# Patient Record
Sex: Male | Born: 1965 | Smoking: Never smoker
Health system: Southern US, Community
[De-identification: ages and names within clinical notes are randomized; demographics above are authoritative.]

## PROBLEM LIST (undated history)

## (undated) HISTORY — PX: ROTATOR CUFF REPAIR: SHX139

## (undated) HISTORY — PX: MENISCUS REPAIR: SHX5179

---

## 2008-10-08 DEATH — deceased

## 2010-10-21 ENCOUNTER — Ambulatory Visit (HOSPITAL_BASED_OUTPATIENT_CLINIC_OR_DEPARTMENT_OTHER)
Admission: RE | Admit: 2010-10-21 | Discharge: 2010-10-21 | Disposition: A | Discharge status: Home / Self Care | Payer: BC Managed Care – PPO | Source: Ambulatory Visit | Attending: Specialist | Admitting: Specialist

## 2010-10-21 DIAGNOSIS — IMO0002 Reserved for concepts with insufficient information to code with codable children: Secondary | ICD-10-CM | POA: Insufficient documentation

## 2010-10-21 DIAGNOSIS — Z01812 Encounter for preprocedural laboratory examination: Secondary | ICD-10-CM | POA: Insufficient documentation

## 2010-10-21 DIAGNOSIS — S83419A Sprain of medial collateral ligament of unspecified knee, initial encounter: Secondary | ICD-10-CM | POA: Insufficient documentation

## 2010-10-21 DIAGNOSIS — Z01810 Encounter for preprocedural cardiovascular examination: Secondary | ICD-10-CM | POA: Insufficient documentation

## 2010-10-21 DIAGNOSIS — M2240 Chondromalacia patellae, unspecified knee: Secondary | ICD-10-CM | POA: Insufficient documentation

## 2010-10-21 DIAGNOSIS — M2350 Chronic instability of knee, unspecified knee: Secondary | ICD-10-CM | POA: Insufficient documentation

## 2010-10-21 DIAGNOSIS — X58XXXA Exposure to other specified factors, initial encounter: Secondary | ICD-10-CM | POA: Insufficient documentation

## 2010-10-21 LAB — POCT I-STAT 4, (NA,K, GLUC, HGB,HCT)
Glucose, Bld: 93 mg/dL (ref 70–99)
Hemoglobin: 14.3 g/dL (ref 13.0–17.0)
Potassium: 3.9 mEq/L (ref 3.5–5.1)
Sodium: 142 mEq/L (ref 135–145)

## 2010-10-22 NOTE — Op Note (Signed)
  [name], Thomas Allison            ACCOUNT NO.:  [id number]  MEDICAL RECORD NO.:  [id number]  LOCATION:                                 FACILITY:  PHYSICIAN:  Jene Every, M.D.    DATE OF BIRTH:  [birth year]  DATE OF PROCEDURE:  10/21/2010 DATE OF DISCHARGE:                              OPERATIVE REPORT   PREOPERATIVE DIAGNOSES:  Medial meniscus tear, right knee, chondromalacia patellofemoral joint, medial collateral ligament sprain.  POSTOPERATIVE DIAGNOSES:  Medial meniscus tear, right knee, chondromalacia patellofemoral joint, medial collateral ligament sprain.  PROCEDURE PERFORMED: 1. Exam under anesthesia. 2. Right knee arthroscopy with partial medial meniscectomy. 3. Chondroplasty, patellofemoral joint.  ANESTHESIA:  General.  ASSISTANT:  None.  BRIEF HISTORY:  45 year old with knee pain, locking, giving way, radial tear of the meniscus, indicated for partial meniscectomy and evaluation of the patellofemoral joint as well as an MCL sprain.  Risks and benefits discussed, including bleeding, infection, damage to vascular structures, no change in symptoms, worsening symptoms, need for repeat debridement, DVT, PE, anesthetic complications, etc.  TECHNIQUE:  The patient in supine position, after induction of adequate anesthesia, and 2 g of Kefzol, the left lower extremity was prepped and draped in the usual sterile fashion.  When examined under anesthesia, there was no laxity of the MCL noted.  A lateral parapatellar portal was fashioned with a #11 blade, an Ingress cannula atraumatically placed. Irrigant was utilized to insufflate the joint.  Under direct visualization, a medial parapatellar portal was fashioned with a #11 blade after localization with an 18-gauge needle, sparing the medial meniscus.  Noted immediately was the medial meniscus radial tear significantly went back to the meniscocapsular junction.  I introduced a basket rongeur and resected and contoured  the radial tear into a curvilinear tear.  We then used a 3.5 Cuda shaver and an ArthroWand to contour the resection and cauterized vessels at the junction.  The remnant was then stable to probe palpation.  The femoral condyle and tibial plateau was unremarkable.  ACL was unremarkable.  Lateral compartment revealed normal femoral condyle, tibial plateau and meniscus stable to probe palpation without evidence of tear.  Suprapatellar pouch revealed extensive grade 3 changes of the patellofemoral joint.  Chondroplasty performed of the patellofemoral joint as well as the sulcus.  There was normal patellofemoral tracking. Gutters were unremarkable.  Next, I revisited all compartments.  No further pathology was amenable to arthroscopic intervention.  Therefore, I removed all instrumentation. Portals were closed with 4-0 nylon simple sutures.  0.25% Marcaine with epinephrine was infiltrated in the joint.  Wound  was dressed sterilely. The patient was awoken without difficulty and transported to the recovery room in satisfactory condition.  The patient tolerated the procedure well.  There was no complication. No assistant.     Jene Every, M.D.     Cordelia Pen  D:  10/21/2010  T:  10/21/2010  Job:  [id number]  Electronically Signed by Jene Every M.D. on 10/22/2010 01:24:50 PM

## 2014-11-15 ENCOUNTER — Other Ambulatory Visit: Payer: Self-pay | Admitting: Orthopaedic Surgery

## 2014-11-15 DIAGNOSIS — M25512 Pain in left shoulder: Secondary | ICD-10-CM

## 2019-09-17 ENCOUNTER — Other Ambulatory Visit: Payer: Self-pay

## 2019-09-17 DIAGNOSIS — T7840XA Allergy, unspecified, initial encounter: Secondary | ICD-10-CM | POA: Insufficient documentation

## 2019-09-17 NOTE — ED Triage Notes (Signed)
Pt arrived via POV with c/o rash to bilateral legs and arms, torso, at buttocks. Pt also states he has some welts on his buttocks.  Pt taking benadryl which has helped some, pt reports he last took benadryl about 1-2 hours ago. PT states he took 50mg  at that time.  Unsure of what he is allergic. Pt denies any outdoor exposure, denies any new foods and no changes in laundry detergents.

## 2019-09-18 ENCOUNTER — Emergency Department
Admission: EM | Admit: 2019-09-18 | Discharge: 2019-09-18 | Disposition: A | Discharge status: Home / Self Care | Payer: 59 | Attending: Emergency Medicine | Admitting: Emergency Medicine

## 2019-09-18 ENCOUNTER — Other Ambulatory Visit: Payer: Self-pay

## 2019-09-18 ENCOUNTER — Emergency Department
Admission: EM | Admit: 2019-09-18 | Discharge: 2019-09-18 | Disposition: A | Discharge status: Home / Self Care | Payer: 59 | Source: Home / Self Care | Attending: Emergency Medicine | Admitting: Emergency Medicine

## 2019-09-18 DIAGNOSIS — T7840XA Allergy, unspecified, initial encounter: Secondary | ICD-10-CM

## 2019-09-18 DIAGNOSIS — L500 Allergic urticaria: Secondary | ICD-10-CM | POA: Insufficient documentation

## 2019-09-18 MED ORDER — DIPHENHYDRAMINE HCL 50 MG/ML IJ SOLN
INTRAMUSCULAR | Status: AC
  Administered 2019-09-18: 50 mg via INTRAVENOUS
  Filled 2019-09-18: qty 1

## 2019-09-18 MED ORDER — METHYLPREDNISOLONE SODIUM SUCC 125 MG IJ SOLR: 125.0000 mg | Freq: Once | INTRAMUSCULAR | Status: AC

## 2019-09-18 MED ORDER — FAMOTIDINE IN NACL 20-0.9 MG/50ML-% IV SOLN
20.0000 mg | Freq: Once | INTRAVENOUS | Status: AC
  Administered 2019-09-18: 20 mg via INTRAVENOUS
  Filled 2019-09-18: qty 50

## 2019-09-18 MED ORDER — CETIRIZINE HCL 10 MG PO TABS: 10.0000 mg | ORAL_TABLET | Freq: Every day | ORAL | 2 refills | Status: AC

## 2019-09-18 MED ORDER — DIPHENHYDRAMINE HCL 50 MG/ML IJ SOLN: 50.0000 mg | Freq: Once | INTRAMUSCULAR | Status: AC

## 2019-09-18 MED ORDER — FAMOTIDINE 20 MG PO TABS: 20.0000 mg | ORAL_TABLET | Freq: Every day | ORAL | 0 refills | Status: AC

## 2019-09-18 MED ORDER — DIPHENHYDRAMINE HCL 25 MG PO CAPS: 50.0000 mg | ORAL_CAPSULE | Freq: Four times a day (QID) | ORAL | 0 refills | Status: AC | PRN

## 2019-09-18 MED ORDER — FAMOTIDINE IN NACL 20-0.9 MG/50ML-% IV SOLN
20.0000 mg | Freq: Once | INTRAVENOUS | Status: AC
  Administered 2019-09-18: 20 mg via INTRAVENOUS

## 2019-09-18 MED ORDER — PREDNISONE 20 MG PO TABS
40.0000 mg | ORAL_TABLET | Freq: Once | ORAL | Status: AC
  Administered 2019-09-18: 40 mg via ORAL
  Filled 2019-09-18: qty 2

## 2019-09-18 MED ORDER — PREDNISONE 50 MG PO TABS: ORAL_TABLET | ORAL | 0 refills | Status: AC

## 2019-09-18 MED ORDER — METHYLPREDNISOLONE SODIUM SUCC 125 MG IJ SOLR
INTRAMUSCULAR | Status: AC
  Administered 2019-09-18: 125 mg via INTRAVENOUS
  Filled 2019-09-18: qty 2

## 2019-09-18 MED ORDER — DIPHENHYDRAMINE HCL 50 MG/ML IJ SOLN
50.0000 mg | Freq: Once | INTRAMUSCULAR | Status: AC
  Administered 2019-09-18: 50 mg via INTRAVENOUS
  Filled 2019-09-18: qty 1

## 2019-09-18 MED ORDER — METHYLPREDNISOLONE SODIUM SUCC 125 MG IJ SOLR
125.0000 mg | Freq: Once | INTRAMUSCULAR | Status: AC
  Administered 2019-09-18: 125 mg via INTRAVENOUS
  Filled 2019-09-18: qty 2

## 2019-09-18 MED ORDER — DIPHENHYDRAMINE HCL 50 MG/ML IJ SOLN
50.0000 mg | Freq: Once | INTRAMUSCULAR | Status: AC
Start: 2019-09-18 — End: 2019-09-18
  Administered 2019-09-18: 50 mg via INTRAVENOUS
  Filled 2019-09-18: qty 1

## 2019-09-18 NOTE — ED Triage Notes (Signed)
Pt seen last night/this morning with c/o rash/possible allergic reaction. Pt states was treated and discharged after rash resolved, pt states went home and rash began to return just PTA. Pt denies any SOB, ambulatory without difficulty, able to speak in full and complete sentences at this time.

## 2019-09-18 NOTE — ED Notes (Signed)
VS obtained by this RN. Pt c/o continued rash at this time. Pt visualized in NAD. VSS. Pt continues to rest in blue recliner at this time.

## 2019-09-18 NOTE — ED Triage Notes (Addendum)
Pt was DC this AM for allergic reaction. States rash has come back. Noted hives to arms. A&O, ambulatory. Unknown what he's reacting to.   Pt also states possible insect bite to L posterior calf.   Took 2 benadryl at 12:30pm

## 2019-09-18 NOTE — ED Provider Notes (Signed)
[facility] Emergency Department Provider Note  ____________________________________________  Time seen: Approximately 7:45 PM  I have reviewed the triage vital signs and the nursing notes.   HISTORY  Chief Complaint Allergic Reaction    HPI Thomas Allison is a 54 y.o. male that presents to the emergency department for evaluation of allergic reaction since yesterday.  Patient developed diffuse hives yesterday.  He was evaluated in this emergency department and given IV Benadryl, Pepcid, Solu-Medrol.  Hives improved following medications.  Patient went home and went to sleep.  When he woke up, hives had returned to both of his arms. He was discharged home on Zyrtec and Pepcid.  He was unsure if he should take more Benadryl.  Patient was given in the waiting room additional Solu-Medrol, Pepcid, Benadryl.  Rash has improved since receiving medications.  He denies any shortness of breath, chest pain, nausea, vomiting, abdominal pain.  He did have a bug bite to his lower leg, unsure of what kind of bug.  He cannot recall anything else that he may be allergic to.  His wife changed the sheets to the bed before he fell asleep this morning.  No new soaps, detergents, body washes, medications, foods, animals.   History reviewed. No pertinent past medical history.  There are no problems to display for this patient.   Past Surgical History:  Procedure Laterality Date  . MENISCUS REPAIR    . ROTATOR CUFF REPAIR      Prior to Admission medications   Medication Sig Start Date End Date Taking? Authorizing Provider  cetirizine (ZYRTEC ALLERGY) 10 MG tablet Take 1 tablet (10 mg total) by mouth daily. 09/18/19 09/17/20  Nita Sickle, MD  diphenhydrAMINE (BENADRYL) 25 mg capsule Take 2 capsules (50 mg total) by mouth every 6 (six) hours as needed. 09/18/19 09/17/20  Enid Derry, PA-C  famotidine (PEPCID) 20 MG tablet Take 1 tablet (20 mg total) by mouth daily for 5 days.  09/18/19 09/23/19  Nita Sickle, MD  predniSONE (DELTASONE) 50 MG tablet Take 1 tablet per day 09/18/19   Enid Derry, PA-C    Allergies Patient has no known allergies.  History reviewed. No pertinent family history.  Social History Social History   Tobacco Use  . Smoking status: Never Smoker  Substance Use Topics  . Alcohol use: Not Currently  . Drug use: Not on file     Review of Systems  Constitutional: No fever/chills ENT: No upper respiratory complaints. Cardiovascular: No chest pain. Respiratory: No SOB. Gastrointestinal: No abdominal pain.  No nausea, no vomiting.  Musculoskeletal: Negative for musculoskeletal pain. Skin: Negative for abrasions, lacerations, ecchymosis. Positive for rash. Neurological: Negative for headaches, numbness or tingling   ____________________________________________   PHYSICAL EXAM:  VITAL SIGNS: ED Triage Vitals  Enc Vitals Group     BP 09/18/19 1400 (!) 149/87     Pulse Rate 09/18/19 1400 (!) 103     Resp 09/18/19 1400 16     Temp 09/18/19 1400 98.4 F (36.9 C)     Temp Source 09/18/19 1400 Oral     SpO2 09/18/19 1400 97 %     Weight 09/18/19 1401 240 lb (108.9 kg)     Height 09/18/19 1401 5\' 8"  (1.727 m)     Head Circumference --      Peak Flow --      Pain Score 09/18/19 1401 3     Pain Loc --      Pain Edu? --  Excl. in GC? --      Constitutional: Alert and oriented. Well appearing and in no acute distress. Eyes: Conjunctivae are normal. PERRL. EOMI. Head: Atraumatic. ENT:      Ears:      Nose: No congestion/rhinnorhea.      Mouth/Throat: Mucous membranes are moist.  Neck: No stridor.  Cardiovascular: Normal rate, regular rhythm.  Good peripheral circulation. Respiratory: Normal respiratory effort without tachypnea or retractions. Lungs CTAB. Good air entry to the bases with no decreased or absent breath sounds. Gastrointestinal: Bowel sounds 4 quadrants. Soft and nontender to palpation. No guarding  or rigidity. No palpable masses. No distention.  Musculoskeletal: Full range of motion to all extremities. No gross deformities appreciated. Neurologic:  Normal speech and language. No gross focal neurologic deficits are appreciated.  Skin:  Skin is warm, dry and intact. Few hives to distal biceps. Psychiatric: Mood and affect are normal. Speech and behavior are normal. Patient exhibits appropriate insight and judgement.   ____________________________________________   LABS (all labs ordered are listed, but only abnormal results are displayed)  Labs Reviewed - No data to display ____________________________________________  EKG   ____________________________________________  RADIOLOGY  No results found.  ____________________________________________    PROCEDURES  Procedure(s) performed:    Procedures    Medications  diphenhydrAMINE (BENADRYL) injection 50 mg (50 mg Intravenous Given 09/18/19 1413)  methylPREDNISolone sodium succinate (SOLU-MEDROL) 125 mg/2 mL injection 125 mg (125 mg Intravenous Given 09/18/19 1412)  famotidine (PEPCID) IVPB 20 mg premix (0 mg Intravenous Stopped 09/18/19 1444)  predniSONE (DELTASONE) tablet 40 mg (40 mg Oral Given 09/18/19 1946)  diphenhydrAMINE (BENADRYL) injection 50 mg (50 mg Intravenous Given 09/18/19 1947)     ____________________________________________   INITIAL IMPRESSION / ASSESSMENT AND PLAN / ED COURSE  Pertinent labs & imaging results that were available during my care of the patient were reviewed by me and considered in my medical decision making (see chart for details).  Review of the [place] CSRS was performed in accordance of the [facility] prior to dispensing any controlled drugs.     Patient's diagnosis is consistent with allergic reaction.  Patient had been evaluated last night for allergic reaction and received Solu-Medrol, Pepcid, Benadryl at that time.  He returned today with return of hives.  Patient was given  Benadryl, Solu-Medrol, Pepcid in the waiting room by MD prior to my evaluation.  Hives have improved.  He still has a few hives to the distal bicep.  He was given a dose of oral prednisone and an additional dose of IV Benadryl, as it has been 6 hours since his previous dose of Benadryl.  Patient denies any shortness of breath, chest pain, nausea, vomiting, abdominal pain or signs of anaphylaxis.  Patient will be discharged home with prescriptions for prednisone and Benadryl.  Dr. Scotty Court is agreeable with plan.  Patient is to follow up with primary care as directed. Patient is given ED precautions to return to the ED for any worsening or new symptoms.  Kaikoa Magro was evaluated in Emergency Department on 09/18/2019 for the symptoms described in the history of present illness. He was evaluated in the context of the global COVID-19 pandemic, which necessitated consideration that the patient might be at risk for infection with the SARS-CoV-2 virus that causes COVID-19. Institutional protocols and algorithms that pertain to the evaluation of patients at risk for COVID-19 are in a state of rapid change based on information released by regulatory bodies including the CDC and federal and state organizations.  These policies and algorithms were followed during the patient's care in the ED.   ____________________________________________  FINAL CLINICAL IMPRESSION(S) / ED DIAGNOSES  Final diagnoses:  Allergic reaction, initial encounter      NEW MEDICATIONS STARTED DURING THIS VISIT:  ED Discharge Orders         Ordered    predniSONE (DELTASONE) 50 MG tablet     Discontinue  Reprint     09/18/19 2029    diphenhydrAMINE (BENADRYL) 25 mg capsule  Every 6 hours PRN     Discontinue  Reprint     09/18/19 2029              This chart was dictated using voice recognition software/Dragon. Despite best efforts to proofread, errors can occur which can change the meaning. Any change was purely  unintentional.    Enid Derry, PA-C 09/18/19 2103    Sharman Cheek, MD 09/18/19 2115

## 2019-09-18 NOTE — ED Provider Notes (Signed)
[facility] Emergency Department Provider Note  ____________________________________________  Time seen: Approximately 5:43 AM  I have reviewed the triage vital signs and the nursing notes.   HISTORY  Chief Complaint Rash   HPI Thomas Allison is a 54 y.o. male with no significant past medical history who presents for evaluation of a rash.  Patient reports being in his usual state of health when he went to bed last night.  Woke up this morning with a diffuse pruritic erythematous rash.  Denies angioedema, throat closing sensation, vomiting or diarrhea, chest pain or shortness of breath, difficulty swallowing or breathing.  Patient denies any new foods, new medications, new detergents/soap.  Patient denies any prior history of allergic reaction.   He took some Benadryl at home with improvement of his symptoms.    Allergies Patient has no known allergies.  Fh Diabetes Type 1/Insulin Dependent Brother Alinda Money   No Known Problems Brother Baldo Ash   Colon polyps Father    Coronary Artery Disease (Blocked arteries around heart) Father    Coronary Artery Disease (Blocked arteries around heart) Maternal Grandfather    Brain cancer Maternal Grandmother    Arthritis Mother    Coronary Artery Disease (Blocked arteries around heart) Paternal Grandfather       Social History Alcohol - no Smoking - no  Review of Systems  Constitutional: Negative for fever. Eyes: Negative for visual changes. ENT: Negative for sore throat. Neck: No neck pain  Cardiovascular: Negative for chest pain. Respiratory: Negative for shortness of breath. Gastrointestinal: Negative for abdominal pain, vomiting or diarrhea. Genitourinary: Negative for dysuria. Musculoskeletal: Negative for back pain. Skin: + rash. Neurological: Negative for headaches, weakness or numbness. Psych: No SI or HI  ____________________________________________   PHYSICAL EXAM:  VITAL  SIGNS: ED Triage Vitals  Enc Vitals Group     BP 09/17/19 2333 94/64     Pulse Rate 09/17/19 2333 93     Resp 09/17/19 2333 18     Temp 09/17/19 2333 99 F (37.2 C)     Temp Source 09/17/19 2333 Oral     SpO2 09/17/19 2333 97 %     Weight 09/17/19 2335 240 lb (108.9 kg)     Height 09/17/19 2335 5\' 8"  (1.727 m)     Head Circumference --      Peak Flow --      Pain Score 09/17/19 2347 0     Pain Loc --      Pain Edu? --      Excl. in GC? --     Constitutional: Alert and oriented. Well appearing and in no apparent distress. HEENT:      Head: Normocephalic and atraumatic.         Eyes: Conjunctivae are normal. Sclera is non-icteric.       Mouth/Throat: Mucous membranes are moist.  No angioedema, airways patent, no stridor, no swelling of the uvula or tongue, no drooling      Neck: Supple with no signs of meningismus. Cardiovascular: Regular rate and rhythm. Respiratory: Normal respiratory effort. Lungs are clear to auscultation bilaterally.  Gastrointestinal: Soft, non tender. Musculoskeletal: No edema, cyanosis, or erythema of extremities. Neurologic: Normal speech and language. Face is symmetric. Moving all extremities. No gross focal neurologic deficits are appreciated. Skin: Skin is warm, dry and intact.  Diffuse hives Psychiatric: Mood and affect are normal. Speech and behavior are normal.  ____________________________________________   LABS (all labs ordered are listed, but only abnormal results are displayed)  Labs Reviewed - No data to display ____________________________________________  EKG  none  ____________________________________________  RADIOLOGY  none  ____________________________________________   PROCEDURES  Procedure(s) performed: None Procedures Critical Care performed:  None ____________________________________________   INITIAL IMPRESSION / ASSESSMENT AND PLAN / ED COURSE   54 y.o. male with no significant past medical history who  presents for evaluation of a rash.  Patient woke up this morning with diffuse hives.  Took some Benadryl at home with minimal relief.  Upon arrival to the emergency room no signs of anaphylaxis.  Patient is covered in hives.  Will give IV Benadryl, Pepcid, Solu-Medrol.  Unclear allergen since patient does not seem to be on any new medications, new foods, or new soaps or detergents.  Recommended follow-up with PCP for testing.  Will monitor for any signs of anaphylaxis.  Old medical records reviewed.  Patient placed on telemetry for close monitoring.  _________________________ 6:35 AM on 09/18/2019 -----------------------------------------  Rash resolved.  Patient remains well-appearing no signs of anaphylaxis.  Will discharge home on Zyrtec and Pepcid.  Discussed my standard return precautions and follow-up with PCP.    _____________________________________________ Please note:  Patient was evaluated in Emergency Department today for the symptoms described in the history of present illness. Patient was evaluated in the context of the global COVID-19 pandemic, which necessitated consideration that the patient might be at risk for infection with the SARS-CoV-2 virus that causes COVID-19. Institutional protocols and algorithms that pertain to the evaluation of patients at risk for COVID-19 are in a state of rapid change based on information released by regulatory bodies including the CDC and federal and state organizations. These policies and algorithms were followed during the patient's care in the ED.  Some ED evaluations and interventions may be delayed as a result of limited staffing during the pandemic.   [place] Controlled Substance Database was reviewed by me. ____________________________________________   FINAL CLINICAL IMPRESSION(S) / ED DIAGNOSES   Final diagnoses:  Allergic reaction, initial encounter      NEW MEDICATIONS STARTED DURING THIS VISIT:  ED Discharge Orders         Ordered     famotidine (PEPCID) 20 MG tablet  Daily     Discontinue  Reprint     09/18/19 0621    cetirizine (ZYRTEC ALLERGY) 10 MG tablet  Daily     Discontinue  Reprint     09/18/19 0998           Note:  This document was prepared using Dragon voice recognition software and may include unintentional dictation errors.    Don Perking, [place], MD 09/18/19 [phone]

## 2019-09-18 NOTE — ED Notes (Signed)
This RN spoke with Dr. Lenard Lance regarding patient care and pt c/o slight worsening of rash and itching returning. No new orders received at this time. This RN explained to patient to call out if he began to feel any SOB, swelling, difficulty breathing/swallowing. Pt states understanding at this time. Pt visualized in NAD. This RN apologized for wait. Pt states understanding at this time. Call bell within reach of patient at this time.

## 2020-05-23 ENCOUNTER — Emergency Department: Payer: 59

## 2020-05-23 ENCOUNTER — Other Ambulatory Visit: Payer: Self-pay

## 2020-05-23 ENCOUNTER — Emergency Department
Admission: EM | Admit: 2020-05-23 | Discharge: 2020-05-23 | Disposition: A | Discharge status: Home / Self Care | Payer: 59 | Attending: Emergency Medicine | Admitting: Emergency Medicine

## 2020-05-23 DIAGNOSIS — R110 Nausea: Secondary | ICD-10-CM | POA: Diagnosis not present

## 2020-05-23 DIAGNOSIS — R42 Dizziness and giddiness: Secondary | ICD-10-CM | POA: Diagnosis present

## 2020-05-23 LAB — BASIC METABOLIC PANEL
Anion gap: 8 (ref 5–15)
BUN: 13 mg/dL (ref 6–20)
CO2: 24 mmol/L (ref 22–32)
Calcium: 9 mg/dL (ref 8.9–10.3)
Chloride: 104 mmol/L (ref 98–111)
Creatinine, Ser: 1 mg/dL (ref 0.61–1.24)
GFR, Estimated: >60 mL/min (ref >60)
Glucose, Bld: 103 mg/dL — ABNORMAL HIGH (ref 70–99)
Potassium: 3.7 mmol/L (ref 3.5–5.1)
Sodium: 136 mmol/L (ref 135–145)

## 2020-05-23 LAB — CBC
HCT: 44.1 % (ref 39.0–52.0)
Hemoglobin: 15.5 g/dL (ref 13.0–17.0)
MCH: 30.6 pg (ref 26.0–34.0)
MCHC: 35.1 g/dL (ref 30.0–36.0)
MCV: 87.2 fL (ref 80.0–100.0)
Platelets: 232 10*3/uL (ref 150–400)
RBC: 5.06 MIL/uL (ref 4.22–5.81)
RDW: 13.4 % (ref 11.5–15.5)
WBC: 8 10*3/uL (ref 4.0–10.5)
nRBC: 0 % (ref 0.0–0.2)

## 2020-05-23 LAB — TROPONIN I (HIGH SENSITIVITY): Troponin I (High Sensitivity): 4 ng/L (ref <18)

## 2020-05-23 NOTE — ED Notes (Signed)
Dr. Funke at bedside. 

## 2020-05-23 NOTE — Discharge Instructions (Addendum)
Follow-up with your primary care doctor for recheck of your blood pressure.

## 2020-05-23 NOTE — ED Provider Notes (Signed)
[facility] Emergency Department Provider Note  ____________________________________________   Event Date/Time   First MD Initiated Contact with Patient 05/23/20 2049     (approximate)  I have reviewed the triage vital signs and the nursing notes.   HISTORY  Chief Complaint dizziness    HPI Rayfield Beem is a 55 y.o. male otherwise healthy comes in with dizziness.  Patient states that he was at work.  Around 9 AM he started feeling lightheaded.  He stepped out of his office and he started getting sweaty and having some nausea and feeling like the room was spinning.  He states that he did not eat anything for breakfast but this is typical for him.  He ate a banana and drink some water and he started feeling better after about 30 minutes.  Currently he is at his baseline self just some fatigue.  Symptoms were constant, moderate, better with the above, unclear what brought it on.  Does report having an episode similar to this 3 months ago but not as bad. Denies any abdominal pain, chest pain, shortness of breath  On review of records from urgent care patient had orthostatics that were normal.  Patient was found to have elevated blood pressure.  And there was concern for T wave inversion however I did review his prior EKG and this was similar.          History reviewed. No pertinent past medical history.  There are no problems to display for this patient.   Past Surgical History:  Procedure Laterality Date  . MENISCUS REPAIR    . ROTATOR CUFF REPAIR      Prior to Admission medications   Medication Sig Start Date End Date Taking? Authorizing Provider  cetirizine (ZYRTEC ALLERGY) 10 MG tablet Take 1 tablet (10 mg total) by mouth daily. 09/18/19 09/17/20  Nita Sickle, MD  diphenhydrAMINE (BENADRYL) 25 mg capsule Take 2 capsules (50 mg total) by mouth every 6 (six) hours as needed. 09/18/19 09/17/20  Enid Derry, PA-C  famotidine (PEPCID) 20 MG  tablet Take 1 tablet (20 mg total) by mouth daily for 5 days. 09/18/19 09/23/19  Nita Sickle, MD  predniSONE (DELTASONE) 50 MG tablet Take 1 tablet per day 09/18/19   Enid Derry, PA-C    Allergies Patient has no known allergies.  No family history on file.  Social History Social History   Tobacco Use  . Smoking status: Never Smoker  Substance Use Topics  . Alcohol use: Not Currently      Review of Systems Constitutional: No fever/chills, dizziness, hot flash Eyes: No visual changes. ENT: No sore throat. Cardiovascular: Denies chest pain. Respiratory: Denies shortness of breath. Gastrointestinal: No abdominal pain.  Positive nausea vomiting.  No diarrhea.  No constipation. Genitourinary: Negative for dysuria. Musculoskeletal: Negative for back pain. Skin: Negative for rash. Neurological: Negative for headaches, focal weakness or numbness. All other ROS negative ____________________________________________   PHYSICAL EXAM:  VITAL SIGNS: ED Triage Vitals [05/23/20 1904]  Enc Vitals Group     BP      Pulse      Resp      Temp      Temp src      SpO2      Weight      Height      Head Circumference      Peak Flow      Pain Score 0     Pain Loc      Pain Edu?  Excl. in GC?     Constitutional: Alert and oriented. Well appearing and in no acute distress. Eyes: Conjunctivae are normal. EOMI. Head: Atraumatic. Nose: No congestion/rhinnorhea. Mouth/Throat: Mucous membranes are moist.   Neck: No stridor. Trachea Midline. FROM Cardiovascular: Normal rate, regular rhythm. Grossly normal heart sounds.  Good peripheral circulation. Respiratory: Normal respiratory effort.  No retractions. Lungs CTAB. Gastrointestinal: Soft and nontender. No distention. No abdominal bruits.  Musculoskeletal: No lower extremity tenderness nor edema.  No joint effusions. Neurologic:  Normal speech and language. No gross focal neurologic deficits are appreciated.  Cranial  nerves intact.  Equal strength in arms and legs Skin:  Skin is warm, dry and intact. No rash noted. Psychiatric: Mood and affect are normal. Speech and behavior are normal. GU: Deferred   ____________________________________________   LABS (all labs ordered are listed, but only abnormal results are displayed)  Labs Reviewed  BASIC METABOLIC PANEL - Abnormal; Notable for the following components:      Result Value   Glucose, Bld 103 (*)    All other components within normal limits  CBC  TROPONIN I (HIGH SENSITIVITY)  TROPONIN I (HIGH SENSITIVITY)   ____________________________________________   ED ECG REPORT I, Concha Se, the attending physician, personally viewed and interpreted this ECG.  Normal sinus rate of 69, no ST elevation, T wave inversion lead III, normal intervals  Reviewed prior EKG looks similar to previous ____________________________________________  RADIOLOGY I, Concha Se, personally viewed and evaluated these images (plain radiographs) as part of my medical decision making, as well as reviewing the written report by the radiologist.  ED MD interpretation: No pneumonia  Official radiology report(s): DG Chest 2 View  Result Date: 05/23/2020 CLINICAL DATA:  Dizziness abnormal EKG EXAM: CHEST - 2 VIEW COMPARISON:  None. FINDINGS: The heart size and mediastinal contours are within normal limits. Both lungs are clear. The visualized skeletal structures are unremarkable. IMPRESSION: No active cardiopulmonary disease. Electronically Signed   By: Jasmine Pang M.D.   On: 05/23/2020 19:48    ____________________________________________   PROCEDURES  Procedure(s) performed (including Critical Care):  Procedures   ____________________________________________   INITIAL IMPRESSION / ASSESSMENT AND PLAN / ED COURSE  Keishawn Rajewski was evaluated in Emergency Department on 05/23/2020 for the symptoms described in the history of present illness. He was  evaluated in the context of the global COVID-19 pandemic, which necessitated consideration that the patient might be at risk for infection with the SARS-CoV-2 virus that causes COVID-19. Institutional protocols and algorithms that pertain to the evaluation of patients at risk for COVID-19 are in a state of rapid change based on information released by regulatory bodies including the CDC and federal and state organizations. These policies and algorithms were followed during the patient's care in the ED.    Patient is a 55 year old gentleman who comes in with episode of dizziness, nausea, feeling overheated.  This happened at 9 AM.  Sent to our ER for EKG and cardiac markers due to concern for possible T wave versions.  Patient's EKG here looks very similar to prior and cardiac marker was negative and symptoms have been going on for greater than 3 hours ago so does not need repeat given low heart score.  He denies any chest pain.  He denies any shortness of breath suggest PE.  No abdominal pain to suggest AAA.  He denies any continued symptoms to suggest stroke.  At this time I suspect is more likely vertigo.  He never lost consciousness  as well to suggest syncope.  Patient blood pressure is elevated and he will follow that up for repeat check with his primary care doctor but at this time he feels comfortable going home  I discussed the provisional nature of ED diagnosis, the treatment so far, the ongoing plan of care, follow up appointments and return precautions with the patient and any family or support people present. They expressed understanding and agreed with the plan, discharged home.           ____________________________________________   FINAL CLINICAL IMPRESSION(S) / ED DIAGNOSES   Final diagnoses:  Dizziness      MEDICATIONS GIVEN DURING THIS VISIT:  Medications - No data to display   ED Discharge Orders    None       Note:  This document was prepared using Dragon  voice recognition software and may include unintentional dictation errors.   Concha Se, MD 05/23/20 2126

## 2020-05-23 NOTE — ED Triage Notes (Signed)
Pt comes from UC with c/o abnormal EKG and dizziness episode this am. Pt states he was at meeting and had to excuse himself because he got dizzy, felt nauseated and diaphoretic.  Pt denies any current symptoms. Pt denies any CP or SOb.

## 2020-05-23 NOTE — ED Notes (Signed)
Per provider, pt okay to be discharged without repeat trop

## 2020-06-11 NOTE — Patient Instructions (Signed)
VESTIBULAR AND BALANCE EVALUATION   HISTORY:  Subjective history of current problem: Thomas Allison is a 55 y.o. male otherwise healthy comes in with dizziness.  Patient states that he was at work.  Around 9 AM he started feeling lightheaded.  He stepped out of his office and he started getting sweaty and having some nausea and feeling like the room was spinning.  He states that he did not eat anything for breakfast but this is typical for him.  He ate a banana and drink some water and he started feeling better after about 30 minutes.  Currently he is at his baseline self just some fatigue.  Symptoms were constant, moderate, better with the above, unclear what brought it on.  Does report having an episode similar to this 3 months ago but not as bad. Denies any abdominal pain, chest pain, shortness of breath  On review of records from urgent care patient had orthostatics that were normal.  Patient was found to have elevated blood pressure.  And there was concern for T wave inversion however I did review his prior EKG and this was similar. Description of dizziness: (vertigo, unsteadiness, lightheadedness, falling, general unsteadiness, whoozy, swimmy-headed sensation, aural fullness) Frequency:  Duration: Symptom nature: (motion provoked, positional, spontaneous, constant, variable, intermittent)   Provocative Factors: Easing Factors:  Progression of symptoms: (better, worse, no change since onset) History of similar episodes:  Falls (yes/no): Number of falls in past 6 months:   Prior Functional Level:   Auditory complaints (tinnitus, pain, drainage, hearing loss, aural fullness): Vision (diplopia, visual field loss, recent changes, last eye exam):  Red Flags: (dysarthria, dysphagia, drop attacks, bowel and bladder changes, recent weight loss/gain) Review of systems negative for red flags.     EXAMINATION  POSTURE:   NEUROLOGICAL SCREEN: (2+ unless otherwise noted.) N=normal   Ab=abnormal  Level Dermatome R L Myotome R L Reflex R L  C3 Anterior Neck N N Sidebend C2-3 N N Jaw CN V    C4 Top of Shoulder N N Shoulder Shrug C4 N N Hoffman's UMN    C5 Lateral Upper Arm N N Shoulder ABD C4-5 N N Biceps C5-6    C6 Lateral Arm/ Thumb N N Arm Flex/ Wrist Ext C5-6 N N Brachiorad. C5-6    C7 Middle Finger N N Arm Ext//Wrist Flex C6-7 N N Triceps C7    C8 4th & 5th Finger N N Flex/ Ext Carpi Ulnaris C8 N N Patellar (L3-4)    T1 Medial Arm N N Interossei T1 N N Gastrocnemius    L2 Medial thigh/groin N N Illiopsoas (L2-3) N N     L3 Lower thigh/med.knee N N Quadriceps (L3-4) N N     L4 Medial leg/lat thigh N N Tibialis Ant (L4-5) N N     L5 Lat. leg & dorsal foot N N EHL (L5) N N     S1 post/lat foot/thigh/leg N N Gastrocnemius (S1-2) N N     S2 Post./med. thigh & leg N N Hamstrings (L4-S3) N N       Cranial Nerves Visual acuity and visual fields are intact  Extraocular muscles are intact  Facial sensation is intact bilaterally  Facial strength is intact bilaterally  Hearing is normal as tested by gross conversation Palate elevates midline, normal phonation  Shoulder shrug strength is intact  Tongue protrudes midline    SOMATOSENSORY:         Sensation  Intact      Diminished         Absent  Light touch       COORDINATION: Finger to Nose: Normal Heel to Shin: Normal Pronator Drift: Negative Rapid Alternating Movements: Normal Finger to Thumb Opposition: Normal  MUSCULOSKELETAL SCREEN: Cervical Spine ROM: WFL and painless in all planes. No gross deficits identified   ROM:   MMT:   Functional Mobility:  Gait: Scanning of visual environment with gait is:    POSTURAL CONTROL TESTS:   Clinical Test of Sensory Interaction for Balance    (CTSIB):  CONDITION TIME STRATEGY SWAY  Eyes open, firm surface 30 seconds ankle   Eyes closed, firm surface 30 seconds ankle   Eyes open, foam surface 30 seconds ankle   Eyes closed, foam surface 30  seconds ankle     OCULOMOTOR / VESTIBULAR TESTING:  Oculomotor Exam- Room Light  Findings Comments  Ocular Alignment {normal/abnormal/not examined:14677}   Ocular ROM {normal/abnormal/not examined:14677}   Spontaneous Nystagmus {normal/abnormal/not examined:14677}   Gaze-Holding Nystagmus {normal/abnormal/not examined:14677}   End-Gaze Nystagmus {normal/abnormal/not examined:14677}   Vergence (normal 2-3") {normal/abnormal/not examined:14677}   Smooth Pursuit {normal/abnormal/not examined:14677}   Cross-Cover Test {normal/abnormal/not examined:14677}   Saccades {normal/abnormal/not examined:14677}   VOR Cancellation {normal/abnormal/not examined:14677}   Left Head Impulse {normal/abnormal/not examined:14677}   Right Head Impulse {normal/abnormal/not examined:14677}   Static Acuity {normal/abnormal/not examined:14677}   Dynamic Acuity {normal/abnormal/not examined:14677}     Oculomotor Exam- Fixation Suppressed  Findings Comments  Ocular Alignment {normal/abnormal/not examined:14677}   Spontaneous Nystagmus {normal/abnormal/not examined:14677}   Gaze-Holding Nystagmus {normal/abnormal/not examined:14677}   End-Gaze Nystagmus {normal/abnormal/not examined:14677}   Head Shaking Nystagmus {normal/abnormal/not examined:14677}   Pressure-Induced Nystagmus {normal/abnormal/not examined:14677}   Hyperventilation Induced Nystagmus {normal/abnormal/not examined:14677}   Skull Vibration Induced Nystagmus {normal/abnormal/not examined:14677}     BPPV TESTS:  Symptoms Duration Intensity Nystagmus  L Dix-Hallpike None   None  R Dix-Hallpike None   None  L Head Roll None   None  R Head Roll None   None  L Sidelying Test      R Sidelying Test        FUNCTIONAL OUTCOME MEASURES   Results Comments  BERG /56 Fall risk, in need of intervention  DGI /24   FGA /30   TUG seconds   5TSTS seconds   6 Minute Walk Test    10 Meter Gait Speed Self-selected: s = m/s; Fastest: s = m/s Below  normative values for full community ambulation  ABC Scale %   DHI /100     ASSESSMENT Clinical Impression: Pt is a pleasant year-old male/male referred for difficulty with baalance. PT examination reveals deficits . Pt presents with deficits in strength, gait and balance. Pt will benefit from skilled PT services to address deficits in balance and decrease risk for future falls.   Low (stable): no personal factors/comorbidities, 1-2 body systems/activity limitations/participation restrictions   Moderate (evolving): 1-2 personal factors/comorbidities, 3 or more body systems/activity limitations/participation restrictions   High (unstable): 3 or more personal factors/comorbidities, 4 or more body systems/activity limitations/participation restrictions    PLAN Next Visit: HEP:    Pt will be independent with HEP in order to improve strength and balance in order to decrease fall risk and improve function at home and work.   Pt will improve BERG by at least 3 points in order to demonstrate clinically significant improvement in balance.    Pt will improve DGI by at least 3 points in order to demonstrate clinically significant improvement in  balance and decreased risk for falls.  Pt will improve ABC by at least 13% in order to demonstrate clinically significant improvement in balance confidence.   Pt will decrease 5TSTS by at least 3 seconds in order to demonstrate clinically significant improvement in LE strength.  Pt will decrease TUG to below 14 seconds/decrease in order to demonstrate decreased fall risk.  Pt will decrease DHI score by at least 18 points in order to demonstrate clinically significant reduction in disability   Pt will increase by at least 46m (160ft) in order to demonstrate clinically significant improvement in cardiopulmonary endurance and community ambulation

## 2020-06-12 ENCOUNTER — Ambulatory Visit: Payer: 59 | Attending: Family Medicine

## 2020-06-12 ENCOUNTER — Other Ambulatory Visit: Payer: Self-pay

## 2020-06-12 DIAGNOSIS — R42 Dizziness and giddiness: Secondary | ICD-10-CM | POA: Insufficient documentation

## 2020-06-12 NOTE — Therapy (Signed)
[facility] [address] [place], [place], [address] Phone: [phone]   Fax:  [phone]  Physical Therapy Screen  Patient Details  Name: Thomas Allison MRN: [id number] Date of Birth: 12-28-1965 No data recorded  Encounter Date: 06/12/2020   PT End of Session - 06/12/20 1647    Visit Number 1    Number of Visits 1    Date for PT Re-Evaluation 07/31/20    PT Start Time 1100    PT Stop Time 1145    PT Time Calculation (min) 45 min    Activity Tolerance Patient tolerated treatment well    Behavior During Therapy [facility] for tasks assessed/performed           History reviewed. No pertinent past medical history.  Past Surgical History:  Procedure Laterality Date  . MENISCUS REPAIR    . ROTATOR CUFF REPAIR      There were no vitals filed for this visit.     VESTIBULAR AND BALANCE SCREEN   HISTORY:  Subjective history of current problem: Pt is a self-referral for a physical therapy screen related to his dizziness. 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 He started having vertigo which lasted 1-2 minutes. 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 He also felt like he was bout to pass out, had nausea, and became diaphoretic. He had to excuse himself and went to his desk. Pt states that the total duration of the symptoms were about 1 hour but gradually improved over that time. He had not eaten that morning and was told by some of his colleagues that it may be related to low blood sugar however he normally does not eat breakfast. He called his PCP who advised him to go to the ER. He went to an urgent care instead and due to some concerns on his EKG and his presentation was sent to the ED for cardiac enzymes. He went to the ER however the EKG changes noted at the urgent care were similar to other prior EKGs the ED  provider had on file and his troponin was negative. He was discharged to follow up with his PCP. His PCP felt that his dizziness was related to peripheral vertigo and he was advised to do the Epley maneuver at home. Pt has not tried the Epley maneuver at home. Since the the initial onset the severe dizziness has resolved but he continues to report baseline dizziness which worsens with quick head movements. He notices that his symptoms are worse in the morning and get better as the day progresses. Symptoms worsen when laying down in bed. Denies any similar episodes in the past. No previous cardiac issues. Denies any chest pain or palpitations. Reports intermittent headaches in the morning but no history of migraines. No visual changes during these episodes. Denies auditory or visual symptoms. He states that if music is too loud he sometimes gets a "static sound in his ear" but otherwise denies tinnitus. Denies numbness/tingling or focal weakness. He does confirm that he developed a cold right after his symptoms started. It has since resolved without any additional intervention.   Description of dizziness: dizziness, lightheadedness, unsteadiness (vertigo, unsteadiness, lightheadedness, falling, general unsteadiness, whoozy, swimmy-headed sensation, aural fullness) Frequency: daily Duration: hours but worsens for seconds with quick head turns Symptom nature: (motion provoked, positional, spontaneous, constant, variable, intermittent) Mostly motion  provoked  Provocative Factors: laying down quickly, quick head motions Easing Factors: Unknown  Progression of symptoms: (better, worse, no change since onset) improved History of similar episodes: one brief episodes which lasted 1-2 minutes three months ago but none prior   Falls (yes/no): no Number of falls in past 6 months: none  Prior Functional Level: Independent with ambulation without assistive device. Works full time for [employer];  Auditory complaints (tinnitus, pain, drainage, hearing loss, aural fullness): Denies Vision (diplopia, visual field loss, recent changes, last eye exam): Denies  Red Flags: Denies (dysarthria, dysphagia, drop attacks, bowel and bladder changes, recent weight loss/gain) Review of systems negative for red flags.     EXAMINATION  POSTURE: WNL  NEUROLOGICAL SCREEN: (2+ unless otherwise noted.) N=normal  Ab=abnormal  Level Dermatome R L Myotome R L Reflex R L  C3 Anterior Neck N N Sidebend C2-3 N N Jaw CN V    C4 Top of Shoulder N N Shoulder Shrug C4 N N Hoffman's UMN    C5 Lateral Upper Arm N N Shoulder ABD C4-5 N N Biceps C5-6    C6 Lateral Arm/ Thumb N N Arm Flex/ Wrist Ext C5-6 N N Brachiorad. C5-6    C7 Middle Finger N N Arm Ext//Wrist Flex C6-7 N N Triceps C7    C8 4th & 5th Finger N N Flex/ Ext Carpi Ulnaris C8 N N Patellar (L3-4)    T1 Medial Arm N N Interossei T1 N N Gastrocnemius    L2 Medial thigh/groin N N Illiopsoas (L2-3) N N     L3 Lower thigh/med.knee N N Quadriceps (L3-4) N N     L4 Medial leg/lat thigh N N Tibialis Ant (L4-5) N N     L5 Lat. leg & dorsal foot N N EHL (L5) N N     S1 post/lat foot/thigh/leg N N Gastrocnemius (S1-2) N N     S2 Post./med. thigh & leg N N Hamstrings (L4-S3) N N       Cranial Nerves Visual acuity and visual fields are intact  Extraocular muscles are intact  Facial sensation is intact bilaterally  Facial strength is intact bilaterally  Hearing is normal as tested by gross conversation Palate elevates midline, normal phonation  Shoulder shrug strength is intact  Tongue protrudes midline    SOMATOSENSORY:         Sensation           Intact      Diminished         Absent  Light touch WNL      COORDINATION: Finger to Nose: Normal Heel to Shin: Normal Pronator Drift: Negative Rapid Alternating Movements: Normal Finger to Thumb Opposition: Normal  MUSCULOSKELETAL SCREEN: Cervical Spine ROM: WFL and painless in all  planes. No gross deficits identified   ROM: WNL  MMT: WNL  Functional Mobility: Independent for transfers and ambulation without assistive device    POSTURAL CONTROL TESTS:   Clinical Test of Sensory Interaction for Balance    (CTSIB): Deferred  OCULOMOTOR   VESTIBULAR TESTING:  Oculomotor Exam- Room Light  Findings Comments  Ocular Alignment normal   Ocular ROM normal   Spontaneous Nystagmus normal   Gaze-Holding Nystagmus normal   End-Gaze Nystagmus normal   Vergence (normal 2-3") not examined   Smooth Pursuit normal   Cross-Cover Test not examined   Saccades normal   VOR Cancellation abnormal Mild dizziness, no saccades observed  Left Head Impulse normal   Right Head Impulse  normal   Static Acuity not examined   Dynamic Acuity not examined     Oculomotor Exam- Fixation Suppressed  Findings Comments  Ocular Alignment normal   Spontaneous Nystagmus normal   Gaze-Holding Nystagmus normal   End-Gaze Nystagmus normal   Head Shaking Nystagmus abnormal Pure horizontal slow L beating nystagmus  Pressure-Induced Nystagmus not examined   Hyperventilation Induced Nystagmus not examined   Skull Vibration Induced Nystagmus abnormal Pure horizontal L beating nystagmus    BPPV TESTS:  Symptoms Duration Intensity Nystagmus  L Dix-Hallpike None   None  R Dix-Hallpike None   None  L Head Roll None   None  R Head Roll None   None  L Sidelying Test      R Sidelying Test        FUNCTIONAL OUTCOME MEASURES: Deferred    Assessment: Pt is a pleasant 55 year-old male self-referred for dizziness. Examination is positive for slow pure horizontal L beating nystagmus with fixation suppression post head shake and post skull vibration. 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 Unclear cause of hypofunction but based on history a vestibular neuritis or labyrinthitis is a possibility.  Pt  would benefit from a full vestibular evaluation and he is in agreement. Front office staff asked to send an order for signature to his PCP.     Recommendations:   Comments:  [x]  Patient would benefit from an MD referral [x]  Patient would benefit from a full PT/OT/ SLP evaluation and treatment. []  No intervention recommended at this time.          Objective measurements completed on examination: See above findings.                             Patient will benefit from skilled therapeutic intervention in order to improve the following deficits and impairments:     Visit Diagnosis: Dizziness and giddiness     Problem List There are no problems to display for this patient.  Zettie Gootee PT, DPT, GCS  Ulmer Degen 06/12/2020, 5:06 PM  [facility] [address] [place], [facility], [address] Phone: [phone]   Fax:  [phone]  Name: Thomas Allison MRN: [id number] Date of Birth: [birth year]

## 2020-06-19 ENCOUNTER — Other Ambulatory Visit: Payer: Self-pay

## 2020-06-19 ENCOUNTER — Ambulatory Visit: Payer: 59

## 2020-06-19 DIAGNOSIS — R42 Dizziness and giddiness: Secondary | ICD-10-CM

## 2020-06-19 NOTE — Therapy (Signed)
[facility] [address]. [place], [place], [address] Phone: [phone]   Fax:  [phone]  Physical Therapy Evaluation  Patient Details  Name: Thomas Allison MRN: [id number] Date of Birth: September 03, 1965 Referring Provider (PT): Dr. Gale Journey   Encounter Date: 06/19/2020   PT End of Session - 06/20/20 1132    Visit Number 1    Number of Visits 9    Date for PT Re-Evaluation 08/14/20    Authorization Type eval: 06/19/20    PT Start Time 1650    PT Stop Time 1745    PT Time Calculation (min) 55 min    Activity Tolerance Patient tolerated treatment well    Behavior During Therapy [facility] for tasks assessed/performed           History reviewed. No pertinent past medical history.  Past Surgical History:  Procedure Laterality Date  . MENISCUS REPAIR    . ROTATOR CUFF REPAIR      There were no vitals filed for this visit.    Subjective Assessment - 06/19/20 1759    Subjective Dizziness    Pertinent History Pt is referred by Dr. Gale Journey for vestibular therapy related to his dizziness. 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 He started having vertigo which lasted 1-2 minutes. 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 He also felt like he was bout to pass out, had nausea, and became diaphoretic. He had to excuse himself and went to his desk. Pt states that the total duration of the symptoms were about 1 hour but gradually improved over that time. He had not eaten that morning and was told by some of his colleagues that it may be related to low blood sugar however he normally does not eat breakfast. He called his PCP who advised him to go to the ER. He went to an urgent care instead and due to some concerns on his EKG and his presentation was sent to the ED for cardiac enzymes. He went to the ER however the  EKG changes noted at the urgent care were similar to other prior EKGs the ED provider had on file and his troponin was negative. He was discharged to follow up with his PCP. His PCP felt that his dizziness was related to peripheral vertigo and he was advised to do the Epley maneuver at home. Pt has not tried the Epley maneuver at home. Since the the initial onset the severe dizziness has resolved but he continues to report baseline dizziness which worsens with quick head movements. He notices that his symptoms are worse in the morning and get better as the day progresses. Symptoms worsen when laying down in bed. Denies any similar episodes in the past. No previous cardiac issues. Denies any chest pain or palpitations. Reports intermittent headaches in the morning but no history of migraines. No visual changes during these episodes. Denies auditory or visual symptoms. He states that if music is too loud he sometimes gets a "static sound in his ear" but otherwise denies tinnitus. Denies numbness/tingling or focal weakness. He does confirm that he developed a cold right after his symptoms started. It has since resolved without any additional intervention.    Diagnostic tests None    Patient Stated Goals Decrease dizziness    Currently in Pain? No/denies  [facility] PT Assessment - 06/19/20 1822      Assessment   Medical Diagnosis Dizziness    Referring Provider (PT) Dr. Gale Journey    Next MD Visit Not reported    Prior Therapy None for this issue, previously screened by PT for this issue      Precautions   Precautions None      Restrictions   Weight Bearing Restrictions No      Balance Screen   Has the patient fallen in the past 6 months No    Has the patient had a decrease in activity level because of a fear of falling?  No    Is the patient reluctant to leave their home because of a fear of falling?  No      Home Nurse, mental health Private residence    Living  Arrangements Spouse/significant other;Children    Available Help at Discharge Family    Type of Home House      Prior Function   Level of Independence Independent    Vocation Full time employment    Vocation Requirements PhD in anatomy and biology, works for [employer]   Overall Cognitive Status Within Functional Limits for tasks assessed      Standardized Balance Assessment   Standardized Balance Assessment Secretary/administrator Test   Sit to Stand Able to stand without using hands and stabilize independently    Standing Unsupported Able to stand safely 2 minutes    Sitting with Back Unsupported but Feet Supported on Floor or Stool Able to sit safely and securely 2 minutes    Stand to Sit Sits safely with minimal use of hands    Transfers Able to transfer safely, minor use of hands    Standing Unsupported with Eyes Closed Able to stand 10 seconds safely    Standing Unsupported with Feet Together Able to place feet together independently and stand 1 minute safely    From Standing, Reach Forward with Outstretched Arm Can reach confidently >25 cm (10")    From Standing Position, Pick up Object from Floor Able to pick up shoe safely and easily    From Standing Position, Turn to Look Behind Over each Shoulder Looks behind from both sides and weight shifts well    Turn 360 Degrees Able to turn 360 degrees safely in 4 seconds or less    Standing Unsupported, Alternately Place Feet on Step/Stool Able to stand independently and safely and complete 8 steps in 20 seconds    Standing Unsupported, One Foot in Front Able to place foot tandem independently and hold 30 seconds    Standing on One Leg Able to lift leg independently and hold > 10 seconds    Total Score 56      Functional Gait  Assessment   Gait assessed  Yes    Gait Level Surface Walks 20 ft in less than 5.5 sec, no assistive devices, good speed, no evidence for imbalance, normal gait pattern,  deviates no more than 6 in outside of the 12 in walkway width.    Change in Gait Speed Able to smoothly change walking speed without loss of balance or gait deviation. Deviate no more than 6 in outside of the 12 in walkway width.    Gait with Horizontal Head Turns Performs head turns smoothly with slight change in gait velocity (eg, minor disruption to smooth gait path), deviates 6-10  in outside 12 in walkway width, or uses an assistive device.    Gait with Vertical Head Turns Performs task with moderate change in gait velocity, slows down, deviates 10-15 in outside 12 in walkway width but recovers, can continue to walk.    Gait and Pivot Turn Pivot turns safely within 3 sec and stops quickly with no loss of balance.    Step Over Obstacle Is able to step over 2 stacked shoe boxes taped together (9 in total height) without changing gait speed. No evidence of imbalance.    Gait with Narrow Base of Support Is able to ambulate for 10 steps heel to toe with no staggering.    Gait with Eyes Closed Walks 20 ft, no assistive devices, good speed, no evidence of imbalance, normal gait pattern, deviates no more than 6 in outside 12 in walkway width. Ambulates 20 ft in less than 7 sec.    Ambulating Backwards Walks 20 ft, no assistive devices, good speed, no evidence for imbalance, normal gait    Steps Alternating feet, no rail.    Total Score 27                  VESTIBULAR AND BALANCE EVALUATION    HISTORY:  Subjective history of current problem: (History obtained during screen on 06/12/20 and reviewed/confirmed during evaluation today) Pt is referred by Dr. Gale Journey for vestibular therapy related to his dizziness. 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 He started having vertigo which lasted 1-2 minutes. 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 He also felt like he was  bout to pass out, had nausea, and became diaphoretic. He had to excuse himself and went to his desk. Pt states that the total duration of the symptoms were about 1 hour but gradually improved over that time. He had not eaten that morning and was told by some of his colleagues that it may be related to low blood sugar however he normally does not eat breakfast. He called his PCP who advised him to go to the ER. He went to an urgent care instead and due to some concerns on his EKG and his presentation was sent to the ED for cardiac enzymes. He went to the ER however the EKG changes noted at the urgent care were similar to other prior EKGs the ED provider had on file and his troponin was negative. He was discharged to follow up with his PCP. His PCP felt that his dizziness was related to peripheral vertigo and he was advised to do the Epley maneuver at home. Pt has not tried the Epley maneuver at home. Since the the initial onset the severe dizziness has resolved but he continues to report baseline dizziness which worsens with quick head movements. He notices that his symptoms are worse in the morning and get better as the day progresses. Symptoms worsen when laying down in bed. Denies any similar episodes in the past. No previous cardiac issues. Denies any chest pain or palpitations. Reports intermittent headaches in the morning but no history of migraines. No visual changes during these episodes. Denies auditory or visual symptoms. He states that if music is too loud he sometimes gets a "static sound in his ear" but otherwise denies tinnitus. Denies numbness/tingling or focal weakness. He does confirm that he developed a cold right after his symptoms started. It has  since resolved without any additional intervention.   Description of dizziness: dizziness, lightheadedness, unsteadiness (vertigo, unsteadiness, lightheadedness, falling, general unsteadiness, whoozy, swimmy-headed sensation, aural  fullness) Frequency: daily Duration: hours but worsens for seconds with quick head turns Symptom nature: (motion provoked, positional, spontaneous, constant, variable, intermittent) Mostly motion provoked  Provocative Factors: laying down quickly, quick head motions Easing Factors: Unknown  Progression of symptoms: (better, worse, no change since onset) improved History of similar episodes: one brief episodes which lasted 1-2 minutes three months ago but none prior   Falls (yes/no): no Number of falls in past 6 months: none  Prior Functional Level: Independent with ambulation without assistive device. Works full time for [employer];  Auditory complaints (tinnitus, pain, drainage, hearing loss, aural fullness): Denies Vision (diplopia, visual field loss, recent changes, last eye exam): Denies  Red Flags: Denies (dysarthria, dysphagia, drop attacks, bowel and bladder changes, recent weight loss/gain) Review of systems negative for red flags.     EXAMINATION (All examination items below performed on 06/12/20 screening)  POSTURE: WNL  NEUROLOGICAL SCREEN: (2+ unless otherwise noted.) N=normal  Ab=abnormal  Level Dermatome R L Myotome R L Reflex R L  C3 Anterior Neck  N N Sidebend C2-3 N N Jaw CN V    C4 Top of Shoulder N N Shoulder Shrug C4 N N Hoffman's UMN     C5 Lateral Upper Arm  N N Shoulder ABD C4-5 N N Biceps C5-6    C6 Lateral Arm/ Thumb  N N Arm Flex/ Wrist Ext C5-6 N N Brachiorad. C5-6     C7 Middle Finger  N N Arm Ext//Wrist Flex C6-7 N N Triceps C7     C8 4th & 5th Finger N N Flex/ Ext Carpi Ulnaris C8 N N Patellar (L3-4)     T1 Medial Arm N N Interossei T1 N N Gastrocnemius    L2 Medial thigh/groin N N Illiopsoas (L2-3) N N     L3 Lower thigh/med.knee N N Quadriceps (L3-4) N N     L4 Medial leg/lat thigh N N Tibialis Ant (L4-5) N N     L5 Lat. leg & dorsal foot N N EHL (L5) N N     S1 post/lat foot/thigh/leg N  N Gastrocnemius (S1-2) N N     S2 Post./med. thigh & leg N N Hamstrings (L4-S3) N N       Cranial Nerves Visual acuity and visual fields are intact  Extraocular muscles are intact  Facial sensation is intact bilaterally  Facial strength is intact bilaterally  Hearing is normal as tested by gross conversation Palate elevates midline, normal phonation  Shoulder shrug strength is intact  Tongue protrudes midline    SOMATOSENSORY:         Sensation           Intact      Diminished         Absent  Light touch WNL                 COORDINATION: Finger to Nose: Normal Heel to Shin: Normal Pronator Drift: Negative Rapid Alternating Movements: Normal Finger to Thumb Opposition: Normal  MUSCULOSKELETAL SCREEN: Cervical Spine ROM:  WFL and painless in all planes. No gross deficits identified               ROM: WNL  MMT: WNL  Functional Mobility: Independent for transfers and ambulation without assistive device    OCULOMOTOR/VESTIBULAR TESTING:  Oculomotor Exam- Room Light  Findings  Comments  Ocular Alignment normal   Ocular ROM normal   Spontaneous Nystagmus normal   Gaze-Holding Nystagmus normal   End-Gaze Nystagmus normal   Vergence (normal 2-3") not examined   Smooth Pursuit normal   Cross-Cover Test not examined   Saccades normal   VOR Cancellation abnormal Mild dizziness, no saccades observed  Left Head Impulse normal   Right Head Impulse normal   Static Acuity not examined   Dynamic Acuity not examined     Oculomotor Exam- Fixation Suppressed  Findings Comments  Ocular Alignment normal   Spontaneous Nystagmus normal   Gaze-Holding Nystagmus normal   End-Gaze Nystagmus normal   Head Shaking Nystagmus abnormal Pure horizontal slow L beating nystagmus  Pressure-Induced Nystagmus not examined   Hyperventilation Induced Nystagmus not examined   Skull Vibration Induced Nystagmus abnormal Pure horizontal L beating  nystagmus     All information below was performed during 06/19/20 visit  BPPV TESTS (repeated today 06/19/20)  Symptoms Duration Intensity Nystagmus  L Dix-Hallpike None   None  R Dix-Hallpike None   None  L Head Roll None   None  R Head Roll None   None  L Sidelying Test      R Sidelying Test        POSTURAL CONTROL TESTS:   Clinical Test of Sensory Interaction for Balance    (CTSIB):  CONDITION TIME STRATEGY SWAY  Eyes open, firm surface 30 seconds ankle 1+  Eyes closed, firm surface 30 seconds ankle 2+  Eyes open, foam surface 30 seconds ankle 2+  Eyes closed, foam surface 30 seconds ankle 3+    FUNCTIONAL OUTCOME MEASURES (Performed today 06/19/20)   Results Comments  BERG 56/56 Fall risk, in need of intervention  FOTO 52 Predicted improvement to 63  FGA 27/30 Mild impairment  ABC Scale 80% WNL  DHI 28/100 Mild self-reported dizziness          TREATMENT  Neuromuscular Re-education  VOR x 1 horizontal in sitting 60s x 2, 2/10 dizziness after first rep and 3/10 dizziness after second repetition; VOR x 1 horizontal in standing with feet apart x 60s x 1, 4/10 dizziness; VOR x 1 verticall in standing with feet apart x 60s x 1, 3/10 dizziness; HEP issued with education about how to perform correctly/safely at home; Forward and retro gait in hallway with ball toss to therapist and head/eye follow x 70' each, both directions;              Objective measurements completed on examination: See above findings.               PT Education - 06/20/20 1132    Education Details Plan of care and HEP    Person(s) Educated Patient    Methods Explanation;Handout    Comprehension Verbalized understanding            PT Short Term Goals - 06/20/20 1141      PT SHORT TERM GOAL #1   Title Pt will be independent with HEP in order to improve dizziness in order to improve symptom-free function at home and work.    Time 4    Period  Weeks    Status New    Target Date 07/17/20             PT Long Term Goals - 06/20/20 1141      PT LONG TERM GOAL #1   Title Pt will increase FGA to 30/30 and be able to perform horizontal  and vertical head turns during ambulation 100% of the time without slowing of speed or lateral staggerring in order to be able to shop for groceries and drive his car without any increase in his symptoms    Baseline 06/19/20: FGA 27/30 (points lost for horizontal and vertical head turns)    Time 8    Period Weeks    Status New    Target Date 08/14/20      PT LONG TERM GOAL #2   Title Pt will decrease DHI score by at least 18 points in order to demonstrate clinically significant reduction in disability    Baseline 06/19/20: 28/100    Time 8    Period Weeks    Status New    Target Date 08/14/20      PT LONG TERM GOAL #3   Title Pt will improve FOTO score to at least 63 in order to demonstrate significant improvement in function related to dizziness.    Baseline 06/19/20: 52    Time 8    Period Weeks    Status New    Target Date 08/14/20                  Plan - 06/20/20 1139    Clinical Impression Statement Pt is a pleasant 55 year-old male referred by Dr. Romeo Apple'Donnell for dizziness. He was previously screened by this physical therapist last week due to his complaints of dizziness to assess his need for physical therapy. At that time an order was requested by his PCP for a full evaluation. During screening the examination was positive for slow pure horizontal L beating nystagmus with fixation suppression post head shake and post skull vibration. BPPV tests were all negative at that time and remain negative when repeated today. Performed additional outcome measures today. Pt scored 56/56 on the BERG and 27/30 on the FGA. He demonstrates most significant issues with horizontal and vertical head turns. His ABC Scale score was WNL and his DHI of 28/100 indicated mild self-reported disability  related to his dizziness. 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 Unclear cause of hypofunction but based on history a vestibular neuritis or labyrinthitis is a possible explanation. Pt will benefit from PT services to address deficits in dizziness and balance in order to return to full function at home and work without symptoms.    Personal Factors and Comorbidities Comorbidity 1    Comorbidities Seasonal allergies    Examination-Activity Limitations Bend;Transfers    Examination-Participation Restrictions Community Activity;Driving;Occupation;Shop    Stability/Clinical Decision Making Stable/Uncomplicated    Clinical Decision Making Low    Rehab Potential Excellent    PT Frequency 1x / week    PT Duration 8 weeks    PT Treatment/Interventions ADLs/Self Care Home Management;Aquatic Therapy;Biofeedback;Canalith Repostioning;Cryotherapy;Electrical Stimulation;Iontophoresis 4mg /ml Dexamethasone;Moist Heat;Traction;Ultrasound;DME Instruction;Gait training;Stair training;Functional mobility training;Therapeutic activities;Therapeutic exercise;Balance training;Neuromuscular re-education;Patient/family education;Manual techniques;Passive range of motion;Dry needling;Vestibular;Spinal Manipulations;Joint Manipulations    PT Next Visit Plan Review HEP, progress adapation and habituation exercises    PT Home Exercise Plan Medbridge Access Code: [email or web address]           Patient will benefit from skilled therapeutic intervention in order to improve the following deficits and impairments:  Decreased balance,Dizziness  Visit Diagnosis: Dizziness and giddiness     Problem List There are no problems to display for this patient.  Sharalyn InkJason D Hagen Tidd PT, DPT, GCS  Tressia Labrum 06/20/2020, 1:37 PM  [facility] [address] [place], [place],  [address] Phone: [phone]   Fax:  [phone]  Name: Thomas Allison MRN: [id number] Date of Birth: [birth year]

## 2020-06-26 ENCOUNTER — Ambulatory Visit: Payer: 59

## 2020-06-28 ENCOUNTER — Ambulatory Visit: Payer: 59

## 2020-06-28 ENCOUNTER — Other Ambulatory Visit: Payer: Self-pay

## 2020-06-28 DIAGNOSIS — R42 Dizziness and giddiness: Secondary | ICD-10-CM | POA: Diagnosis not present

## 2020-06-28 NOTE — Patient Instructions (Addendum)
Access Code: [email or web address] URL: [email or web address] Date: 06/28/2020 Prepared by: Ria Comment  Exercises Walking Gaze Stabilization Head Rotation - 4 x daily - 7 x weekly - 3 reps - 60s hold 360 Degree Turn in Both Directions - 2 x daily - 7 x weekly - 3 reps - 60s hold Tandem Stance with Head Rotation - 2 x daily - 7 x weekly - 3 x 30s with each foot forward hold

## 2020-06-28 NOTE — Therapy (Signed)
[facility] [address] [place], [place], [address] Phone: [phone]   Fax:  [phone]  Physical Therapy Treatment  Patient Details  Name: Thomas Allison MRN: [id number] Date of Birth: [birth year] Referring Provider (PT): Dr. Gale Journey   Encounter Date: 06/28/2020   PT End of Session - 06/28/20 1704    Visit Number 2    Number of Visits 9    Date for PT Re-Evaluation 08/14/20    Authorization Type eval: 06/19/20    PT Start Time 1705    PT Stop Time 1745    PT Time Calculation (min) 40 min    Activity Tolerance Patient tolerated treatment well    Behavior During Therapy [facility] for tasks assessed/performed           History reviewed. No pertinent past medical history.  Past Surgical History:  Procedure Laterality Date  . MENISCUS REPAIR    . ROTATOR CUFF REPAIR      There were no vitals filed for this visit.   Subjective Assessment - 06/28/20 1703    Subjective Pt reports that he is doing well today. He denies any resting dizziness upon arrival and states that it has been slightly better since his last therapy session. He has been performing his HEP. No specific questions or concerns.    Pertinent History Pt is referred by Dr. Gale Journey for vestibular therapy related to his dizziness. 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 He started having vertigo which lasted 1-2 minutes. 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 He also felt like he was bout to pass out, had nausea, and became diaphoretic. He had to excuse himself and went to his desk. Pt states that the total duration of the symptoms were about 1 hour but gradually improved over that time. He had not eaten that morning and was told by some of his colleagues that it may be related to low blood sugar however he normally does not eat  breakfast. He called his PCP who advised him to go to the ER. He went to an urgent care instead and due to some concerns on his EKG and his presentation was sent to the ED for cardiac enzymes. He went to the ER however the EKG changes noted at the urgent care were similar to other prior EKGs the ED provider had on file and his troponin was negative. He was discharged to follow up with his PCP. His PCP felt that his dizziness was related to peripheral vertigo and he was advised to do the Epley maneuver at home. Pt has not tried the Epley maneuver at home. Since the the initial onset the severe dizziness has resolved but he continues to report baseline dizziness which worsens with quick head movements. He notices that his symptoms are worse in the morning and get better as the day progresses. Symptoms worsen when laying down in bed. Denies any similar episodes in the past. No previous cardiac issues. Denies any chest pain or palpitations. Reports intermittent headaches in the morning but no history of migraines. No visual changes during these episodes. Denies auditory or visual symptoms. He states that if music is too loud he sometimes gets a "static sound in his ear" but otherwise denies tinnitus. Denies numbness/tingling or focal weakness. He does confirm that he developed a cold right after his symptoms started.  It has since resolved without any additional intervention.    Diagnostic tests None    Patient Stated Goals Decrease dizziness    Currently in Pain? No/denies              TREATMENT   Neuromuscular Re-education  VOR x 1 horizontal in standing with feet together x 60s, plain background, 3/10 dizziness; VOR x 1 horizontal in semitandem x 60s, plain background, 4/10 dizziness; VOR x 1 horizontal in semitandem x 60s, busy background, 4/10 dizziness; VOR x 2 horizontal in wide stance x 60s, 2/10 dizziness, pt struggles to perform correctly so discontinued; VOR x 1 horizontal with forward and  backward ambulation 75' x 2 each; Forward and retro gait in hallway with vertical ball toss to self with head/eye follow x 70' each, pt reports dizziness during retro ambulation; Forward and retro gait in hallway with horizontal ball toss to therapist and head/eye follow x 70' each to both sides, dizziness reported during retro ambulation; Head turns on command to read letters on walls during forward gait 70' x 2, pt denies dizziness;; Single body rolls on wall with eyes open x 4, mild dizziness; Double body rolls on wall with eyes open x 4, mild to moderate dizziness; Single body rolls on wall with eyes closed x 4, mild to moderate dizziness; Tandem balance with horizontal and vertical head turns alternating forward LE x 30s each bilaterally, intermittent LOB; Airex NBOS eyes closed x 30s, added horizontal and vertical head turns x 30s each; HEP progression with education about how to perform correctly/safely at home   Pt educated throughout session about proper posture and technique with exercises. Improved exercise technique, movement at target joints, use of target muscles after min to mod verbal, visual, tactile cues.     Progression of VOR x 1 horizontal to forward and backward ambulation today with patient which is added to HEP. Attempted VOR x 2 horizontal however pt is unable to perform correctly so discontinued. He also reports dizziness with body rolls on wall so added to HEP as well as tandem balance with horizontal head turns. Pt encouraged to progress HEP and follow-up was scheduled. Will continue to progress adaptation and habituation exercises to continue improving symptoms and function at home and work.                         PT Education - 06/28/20 2143    Education Details HEP progression    Person(s) Educated Patient    Methods Explanation;Handout    Comprehension Verbalized understanding            PT Short Term Goals - 06/20/20 1141      PT  SHORT TERM GOAL #1   Title Pt will be independent with HEP in order to improve dizziness in order to improve symptom-free function at home and work.    Time 4    Period Weeks    Status New    Target Date 07/17/20             PT Long Term Goals - 06/20/20 1141      PT LONG TERM GOAL #1   Title Pt will increase FGA to 30/30 and be able to perform horizontal and vertical head turns during ambulation 100% of the time without slowing of speed or lateral staggerring in order to be able to shop for groceries and drive his car without any increase in his symptoms    Baseline 06/19/20:  FGA 27/30 (points lost for horizontal and vertical head turns)    Time 8    Period Weeks    Status New    Target Date 08/14/20      PT LONG TERM GOAL #2   Title Pt will decrease DHI score by at least 18 points in order to demonstrate clinically significant reduction in disability    Baseline 06/19/20: 28/100    Time 8    Period Weeks    Status New    Target Date 08/14/20      PT LONG TERM GOAL #3   Title Pt will improve FOTO score to at least 63 in order to demonstrate significant improvement in function related to dizziness.    Baseline 06/19/20: 52    Time 8    Period Weeks    Status New    Target Date 08/14/20                 Plan - 06/28/20 1704    Clinical Impression Statement Progression of VOR x 1 horizontal to forward and backward ambulation today with patient which is added to HEP. Attempted VOR x 2 horizontal however pt is unable to perform correctly so discontinued. He also reports dizziness with body rolls on wall so added to HEP as well as tandem balance with horizontal head turns. Pt encouraged to progress HEP and follow-up was scheduled. Will continue to progress adaptation and habituation exercises to continue improving symptoms and function at home and work.    Personal Factors and Comorbidities Comorbidity 1    Comorbidities Seasonal allergies    Examination-Activity Limitations  Bend;Transfers    Examination-Participation Restrictions Community Activity;Driving;Occupation;Shop    Stability/Clinical Decision Making Stable/Uncomplicated    Rehab Potential Excellent    PT Frequency 1x / week    PT Duration 8 weeks    PT Treatment/Interventions ADLs/Self Care Home Management;Aquatic Therapy;Biofeedback;Canalith Repostioning;Cryotherapy;Electrical Stimulation;Iontophoresis 4mg /ml Dexamethasone;Moist Heat;Traction;Ultrasound;DME Instruction;Gait training;Stair training;Functional mobility training;Therapeutic activities;Therapeutic exercise;Balance training;Neuromuscular re-education;Patient/family education;Manual techniques;Passive range of motion;Dry needling;Vestibular;Spinal Manipulations;Joint Manipulations    PT Next Visit Plan Review HEP, progress adapation and habituation exercises    PT Home Exercise Plan Medbridge Access Code: [email or web address]           Patient will benefit from skilled therapeutic intervention in order to improve the following deficits and impairments:  Decreased balance,Dizziness  Visit Diagnosis: Dizziness and giddiness     Problem List There are no problems to display for this patient.  Federick Levene PT, DPT, GCS  Caliyah Sieh 06/28/2020, 10:05 PM  [place] [facility] [address] [place], [place], [place] Phone: [phone]   Fax:  [phone]  Name: Thomas Allison MRN: Halina Andreas Date of Birth: 11/21/1965

## 2020-07-02 NOTE — Patient Instructions (Addendum)
Access Code: [email or web address] URL: [email or web address] Date: 07/03/2020 Prepared by: Ria Comment  Exercises Walking Gaze Stabilization Head Rotation - 4 x daily - 7 x weekly - 3 reps - 60s hold 360 Degree Turn in Both Directions - 2 x daily - 7 x weekly - 3 reps - 60s hold Tandem Stance with Head Rotation - 2 x daily - 7 x weekly - 3 x 30s with each foot forward hold Single Leg Balance with Eyes Closed - 2 x daily - 7 x weekly - 10 reps - 3 x 30s on each foot hold

## 2020-07-03 ENCOUNTER — Ambulatory Visit: Payer: 59

## 2020-07-03 ENCOUNTER — Other Ambulatory Visit: Payer: Self-pay

## 2020-07-03 DIAGNOSIS — R42 Dizziness and giddiness: Secondary | ICD-10-CM | POA: Diagnosis not present

## 2020-07-03 NOTE — Therapy (Signed)
[place] [facility] [address] [place], [place], [address] Phone: [phone]   Fax:  [phone]  Physical Therapy Treatment  Patient Details  Name: Thomas Allison MRN: [id number] Date of Birth: 26-Jun-1965 Referring Provider (PT): Dr. Gale Journey   Encounter Date: 07/03/2020   PT End of Session - 07/03/20 1358    Visit Number 3    Number of Visits 9    Date for PT Re-Evaluation 08/14/20    Authorization Type eval: 06/19/20    PT Start Time 1400    PT Stop Time 1445    PT Time Calculation (min) 45 min    Equipment Utilized During Treatment Gait belt    Activity Tolerance Patient tolerated treatment well    Behavior During Therapy [facility] for tasks assessed/performed           History reviewed. No pertinent past medical history.  Past Surgical History:  Procedure Laterality Date  . MENISCUS REPAIR    . ROTATOR CUFF REPAIR      There were no vitals filed for this visit.   Subjective Assessment - 07/03/20 1357    Subjective Pt reports that he is doing well today. He denies any resting dizziness upon arrival and states that he had a couple fleeting episodes of dizziness yesterday at work which only lasted a couple seconds.  He has been performing his HEP which does provoke some of his symptoms. No specific questions or concerns.    Pertinent History Pt is referred by Dr. Gale Journey for vestibular therapy related to his dizziness. 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 He started having vertigo which lasted 1-2 minutes. 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 He also felt like he was bout to pass out, had nausea, and became diaphoretic. He had to excuse himself and went to his desk. Pt states that the total duration of the symptoms were about 1 hour but gradually improved over that time. He had not eaten  that morning and was told by some of his colleagues that it may be related to low blood sugar however he normally does not eat breakfast. He called his PCP who advised him to go to the ER. He went to an urgent care instead and due to some concerns on his EKG and his presentation was sent to the ED for cardiac enzymes. He went to the ER however the EKG changes noted at the urgent care were similar to other prior EKGs the ED provider had on file and his troponin was negative. He was discharged to follow up with his PCP. His PCP felt that his dizziness was related to peripheral vertigo and he was advised to do the Epley maneuver at home. Pt has not tried the Epley maneuver at home. Since the the initial onset the severe dizziness has resolved but he continues to report baseline dizziness which worsens with quick head movements. He notices that his symptoms are worse in the morning and get better as the day progresses. Symptoms worsen when laying down in bed. Denies any similar episodes in the past. No previous cardiac issues. Denies any chest pain or palpitations. Reports intermittent headaches in the morning but no history of migraines. No visual changes during these episodes. Denies auditory or visual symptoms. He states that if music is too loud he sometimes gets a "static sound in  his ear" but otherwise denies tinnitus. Denies numbness/tingling or focal weakness. He does confirm that he developed a cold right after his symptoms started. It has since resolved without any additional intervention.    Diagnostic tests None    Patient Stated Goals Decrease dizziness    Currently in Pain? No/denies              TREATMENT   Neuromuscular Re-education VOR x 1 horizontal with forward and backward ambulation 75' x 3 each, mild dizziness reported particularly with retro ambulation; Forward and retro gait in hallway with vertical ball toss to self with head/eye follow x 70' each, pt reports dizziness during  retro ambulation; Forward and retro gait in hallway with horizontal ball toss to therapist and head/eye follow x 70' each to both sides, dizziness reported during retro ambulation; Forward and retro gait in hallway with horizontal ball toss to therapist and head/eye follow x 70' each to both sides, dizziness reported during retro ambulation; Double body rolls on wall with eyes closed  x 4, mild to moderate dizziness; Double body rolls off wall with eyes closed  x 4, mild to moderate dizziness; Airex balance beam tandem gait x 6 lengths; Airex tandem balance with horizontal and vertical head turns alternating forward LE x 30s each bilaterally, intermittent LOB; Airex balance beam side stepping x 6 lengths; Airex balance beam side stepping with horizontal and vertical head turns x 6 lengths each; Airex cone stacking on shelf with pt going from knee height to overhead x multiple bouts to each side, head and eye follow; Airex single leg balance 2 x 30s each; Airex single leg balance with ball tosses to therapist x 30s each;   Pt educated throughout session about proper posture and technique with exercises. Improved exercise technique, movement at target joints, use of target muscles after min to mod verbal, visual, tactile cues.     Continued with VOR x 1 horizontal during forward and backward ambulation today and pt reports mild dizziness during retro ambulation. Continued with body rolls but only with eyes closed today. Progressed HEP today. He will likely only need a few more sessions of progressions until he can continue independently. Pt encouraged to progress HEP and follow-up was scheduled. Will continue to progress adaptation and habituation exercises to continue improving symptoms and function at home and work.                            PT Education - 07/03/20 1447    Education Details HEP progression    Person(s) Educated Patient    Methods Handout     Comprehension Verbalized understanding            PT Short Term Goals - 06/20/20 1141      PT SHORT TERM GOAL #1   Title Pt will be independent with HEP in order to improve dizziness in order to improve symptom-free function at home and work.    Time 4    Period Weeks    Status New    Target Date 07/17/20             PT Long Term Goals - 06/20/20 1141      PT LONG TERM GOAL #1   Title Pt will increase FGA to 30/30 and be able to perform horizontal and vertical head turns during ambulation 100% of the time without slowing of speed or lateral staggerring in order to be able to shop  for groceries and drive his car without any increase in his symptoms    Baseline 06/19/20: FGA 27/30 (points lost for horizontal and vertical head turns)    Time 8    Period Weeks    Status New    Target Date 08/14/20      PT LONG TERM GOAL #2   Title Pt will decrease DHI score by at least 18 points in order to demonstrate clinically significant reduction in disability    Baseline 06/19/20: 28/100    Time 8    Period Weeks    Status New    Target Date 08/14/20      PT LONG TERM GOAL #3   Title Pt will improve FOTO score to at least 63 in order to demonstrate significant improvement in function related to dizziness.    Baseline 06/19/20: 52    Time 8    Period Weeks    Status New    Target Date 08/14/20                 Plan - 07/03/20 1358    Clinical Impression Statement Continued with VOR x 1 horizontal during forward and backward ambulation today and pt reports mild dizziness during retro ambulation. Continued with body rolls but only with eyes closed today. Progressed HEP today. He will likely only need a few more sessions of progressions until he can continue independently. Pt encouraged to progress HEP and follow-up was scheduled. Will continue to progress adaptation and habituation exercises to continue improving symptoms and function at home and work.    Personal Factors and  Comorbidities Comorbidity 1    Comorbidities Seasonal allergies    Examination-Activity Limitations Bend;Transfers    Examination-Participation Restrictions Community Activity;Driving;Occupation;Shop    Stability/Clinical Decision Making Stable/Uncomplicated    Rehab Potential Excellent    PT Frequency 1x / week    PT Duration 8 weeks    PT Treatment/Interventions ADLs/Self Care Home Management;Aquatic Therapy;Biofeedback;Canalith Repostioning;Cryotherapy;Electrical Stimulation;Iontophoresis 4mg /ml Dexamethasone;Moist Heat;Traction;Ultrasound;DME Instruction;Gait training;Stair training;Functional mobility training;Therapeutic activities;Therapeutic exercise;Balance training;Neuromuscular re-education;Patient/family education;Manual techniques;Passive range of motion;Dry needling;Vestibular;Spinal Manipulations;Joint Manipulations    PT Next Visit Plan Review HEP, progress adapation and habituation exercises    PT Home Exercise Plan Medbridge Access Code: [email or web address]           Patient will benefit from skilled therapeutic intervention in order to improve the following deficits and impairments:  Decreased balance,Dizziness  Visit Diagnosis: Dizziness and giddiness     Problem List There are no problems to display for this patient.  Larri Brewton PT, DPT, GCS  Zian Delair 07/03/2020, 4:36 PM  [place] [facility] [address] [place], [place], [place] Phone: [phone]   Fax:  [phone]  Name: Jihaad Bruschi MRN: Halina Andreas Date of Birth: 1966/03/01

## 2020-07-09 NOTE — Patient Instructions (Addendum)
Access Code: [email or web address] URL: [email or web address] Date: 07/10/2020 Prepared by: Ria Comment  Exercises Walking Gaze Stabilization Head Rotation - 4 x daily - 7 x weekly - 3 reps - 60s hold 360 Degree Turn in Both Directions - 2 x daily - 7 x weekly - 3 reps - 60s hold Tandem Stance with Head Rotation - 2 x daily - 7 x weekly - 3 x 30s with each foot forward hold Single Leg Balance with Eyes Closed - 2 x daily - 7 x weekly - 3 x 30s on each foot hold Ball Toss with Eye Tracking While Walking - 2 x daily - 7 x weekly - 3 reps - 60s hold

## 2020-07-10 ENCOUNTER — Ambulatory Visit: Payer: 59 | Attending: Family Medicine

## 2020-07-10 ENCOUNTER — Other Ambulatory Visit: Payer: Self-pay

## 2020-07-10 DIAGNOSIS — R42 Dizziness and giddiness: Secondary | ICD-10-CM | POA: Diagnosis not present

## 2020-07-10 NOTE — Therapy (Signed)
[facility] [address] [place], [place], [address] Phone: [phone]   Fax:  [phone]  Physical Therapy Treatment/Goal Update  Patient Details  Name: Thomas Allison MRN: [id number] Date of Birth: Feb 14, 1966 Referring Provider (PT): Dr. Mcarthur Rossetti   Encounter Date: 07/10/2020   PT End of Session - 07/10/20 1408    Visit Number 4    Number of Visits 9    Date for PT Re-Evaluation 08/14/20    Authorization Type eval: 06/19/20    PT Start Time 1405    PT Stop Time 1445    PT Time Calculation (min) 40 min    Equipment Utilized During Treatment Gait belt    Activity Tolerance Patient tolerated treatment well    Behavior During Therapy [facility] for tasks assessed/performed           History reviewed. No pertinent past medical history.  Past Surgical History:  Procedure Laterality Date  . MENISCUS REPAIR    . ROTATOR CUFF REPAIR      There were no vitals filed for this visit.   Subjective Assessment - 07/10/20 1405    Subjective Pt reports that he is doing well today. He denies any resting dizziness upon arrival, reports one or maybe two spells over the course of hte last week. He has been performing his HEP and is not having dizziness with his home program. No specific questions or concerns.    Pertinent History Pt is referred by Dr. Mcarthur Rossetti for vestibular therapy related to his dizziness. 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 He started having vertigo which lasted 1-2 minutes. 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 He also felt like he was bout to pass out, had nausea, and became diaphoretic. He had to excuse himself and went to his desk. Pt states that the total duration of the symptoms were about 1 hour but gradually improved over that time. He had not eaten that morning and was told by some  of his colleagues that it may be related to low blood sugar however he normally does not eat breakfast. He called his PCP who advised him to go to the ER. He went to an urgent care instead and due to some concerns on his EKG and his presentation was sent to the ED for cardiac enzymes. He went to the ER however the EKG changes noted at the urgent care were similar to other prior EKGs the ED provider had on file and his troponin was negative. He was discharged to follow up with his PCP. His PCP felt that his dizziness was related to peripheral vertigo and he was advised to do the Epley maneuver at home. Pt has not tried the Epley maneuver at home. Since the the initial onset the severe dizziness has resolved but he continues to report baseline dizziness which worsens with quick head movements. He notices that his symptoms are worse in the morning and get better as the day progresses. Symptoms worsen when laying down in bed. Denies any similar episodes in the past. No previous cardiac issues. Denies any chest pain or palpitations. Reports intermittent headaches in the morning but no history of migraines. No visual changes during these episodes. Denies auditory or visual symptoms. He states that if music is too loud he sometimes gets a "static sound in his ear" but otherwise denies  tinnitus. Denies numbness/tingling or focal weakness. He does confirm that he developed a cold right after his symptoms started. It has since resolved without any additional intervention.    Diagnostic tests None    Patient Stated Goals Decrease dizziness    Currently in Pain? No/denies              [facility] PT Assessment - 07/10/20 1417      Functional Gait  Assessment   Gait assessed  Yes    Gait Level Surface Walks 20 ft in less than 5.5 sec, no assistive devices, good speed, no evidence for imbalance, normal gait pattern, deviates no more than 6 in outside of the 12 in walkway width.    Change in Gait Speed Able to smoothly  change walking speed without loss of balance or gait deviation. Deviate no more than 6 in outside of the 12 in walkway width.    Gait with Horizontal Head Turns Performs head turns smoothly with no change in gait. Deviates no more than 6 in outside 12 in walkway width    Gait with Vertical Head Turns Performs head turns with no change in gait. Deviates no more than 6 in outside 12 in walkway width.    Gait and Pivot Turn Pivot turns safely within 3 sec and stops quickly with no loss of balance.    Step Over Obstacle Is able to step over 2 stacked shoe boxes taped together (9 in total height) without changing gait speed. No evidence of imbalance.    Gait with Narrow Base of Support Is able to ambulate for 10 steps heel to toe with no staggering.    Gait with Eyes Closed Walks 20 ft, no assistive devices, good speed, no evidence of imbalance, normal gait pattern, deviates no more than 6 in outside 12 in walkway width. Ambulates 20 ft in less than 7 sec.    Ambulating Backwards Walks 20 ft, no assistive devices, good speed, no evidence for imbalance, normal gait    Steps Alternating feet, no rail.    Total Score 30            TREATMENT   Neuromuscular Re-education Updated goals with patient: DHI: 16/100 FOTO: 56 FGA: 30/30  Repeated oculomotor/vestibular examination with infrared goggles:  Oculomotor Exam- Fixation Suppressed  Findings Comments  Ocular Alignment normal   Spontaneous Nystagmus normal   Gaze-Holding Nystagmus normal   End-Gaze Nystagmus normal   Head Shaking Nystagmus normal   Pressure-Induced Nystagmus not examined   Hyperventilation Induced Nystagmus not examined   Skull Vibration Induced Nystagmus normal     VOR x 1 horizontalwith forward and backward ambulation75' x 3 each, mild dizziness reported particularly with retro ambulation; Forward and retro gait in hallway withhorizontalball toss to therapist and head/eye follow x 70' eachto both sides, no  dizziness; Forward and retro gait in hallway withhorizontalball bounce to therapist and head/eye follow x 70' eachto both sides, no dizziness; Forward gait outside on grass with horizontal and vertical head turns; Forward and retro gait on outside on grass with eyes closed; Airex tandem balance with horizontal and vertical head turnsalternating forward LE x 30s each bilaterally, intermittent LOB; Updated HEP with patient;   Pt educated throughout session about proper posture and technique with exercises. Improved exercise technique, movement at target joints, use of target muscles after min to mod verbal, visual, tactile cues.    Updated goals with patient today. His [place] [address] improved from 28/100 at initial evaluation to 16/100 today. His  FOTO improved to 56 today and his FGA improved to 30/30. Repeated fixation suppression oculomotor/vestibular examination and no signs of nystagmus currently. Pt has made significant progress toward his goals and therapy sessions will be spaced out to every other week. Continued with VOR x 1 horizontal during forward and backward ambulation today as well as ball tosses and gait outsie on the grass. Progressed HEP today. Will continue to progress adaptation and habituation exercises to continue improving symptoms and function at home and work.                       PT Short Term Goals - 07/10/20 1408      PT SHORT TERM GOAL #1   Title Pt will be independent with HEP in order to improve dizziness in order to improve symptom-free function at home and work.    Time 4    Period Weeks    Status Achieved    Target Date 07/17/20             PT Long Term Goals - 07/10/20 1408      PT LONG TERM GOAL #1   Title Pt will increase FGA to 30/30 and be able to perform horizontal and vertical head turns during ambulation 100% of the time without slowing of speed or lateral staggerring in order to be able to shop for groceries and drive his  car without any increase in his symptoms    Baseline 06/19/20: FGA 27/30 (points lost for horizontal and vertical head turns); 07/10/20: 30/30    Time 8    Period Weeks    Status Achieved      PT LONG TERM GOAL #2   Title Pt will decrease DHI score by at least 18 points in order to demonstrate clinically significant reduction in disability    Baseline 06/19/20: 28/100; 07/10/20: 16/22    Time 8    Period Weeks    Status Partially Met    Target Date 08/14/20      PT LONG TERM GOAL #3   Title Pt will improve FOTO score to at least 63 in order to demonstrate significant improvement in function related to dizziness.    Baseline 06/19/20: 52; 07/10/20: 56    Time 8    Period Weeks    Status Partially Met    Target Date 08/14/20                 Plan - 07/10/20 1408    Clinical Impression Statement Updated goals with patient today. His [place] improved from 28/100 at initial evaluation to 16/100 today. His FOTO improved to 56 today and his FGA improved to 30/30. Repeated fixation suppression oculomotor/vestibular examination and no signs of nystagmus currently. Pt has made significant progress toward his goals and therapy sessions will be spaced out to every other week. Continued with VOR x 1 horizontal during forward and backward ambulation today as well as ball tosses and gait outsie on the grass. Progressed HEP today. Will continue to progress adaptation and habituation exercises to continue improving symptoms and function at home and work.    Personal Factors and Comorbidities Comorbidity 1    Comorbidities Seasonal allergies    Examination-Activity Limitations [place];Transfers    Examination-Participation Restrictions Community Activity;Driving;Occupation;Shop    Stability/Clinical Decision Making Stable/Uncomplicated    Rehab Potential Excellent    PT Frequency 1x / week    PT Duration 8 weeks    PT Treatment/Interventions ADLs/Self Care Home Management;Aquatic  Therapy;Biofeedback;Canalith  Repostioning;Cryotherapy;Electrical Stimulation;Iontophoresis 69m/ml Dexamethasone;Moist Heat;Traction;Ultrasound;DME Instruction;Gait training;Stair training;Functional mobility training;Therapeutic activities;Therapeutic exercise;Balance training;Neuromuscular re-education;Patient/family education;Manual techniques;Passive range of motion;Dry needling;Vestibular;Spinal Manipulations;Joint Manipulations    PT Next Visit Plan Review HEP, progress adapation and habituation exercises    PT Home Exercise Plan Medbridge Access Code: [email or web address]           Patient will benefit from skilled therapeutic intervention in order to improve the following deficits and impairments:  Decreased balance,Dizziness  Visit Diagnosis: Dizziness and giddiness     Problem List There are no problems to display for this patient.  JLyndel SafeHuprich PT, DPT, GCS  Sanjith Siwek 07/10/2020, 3:09 PM  [place] [facility] [place] [address]Phone: [phone]  Fax:  [phone] Name: RNixon KoltonMRN: [id number] of Birth: 7Feb 29, 1967

## 2020-07-24 ENCOUNTER — Ambulatory Visit: Payer: 59

## 2020-07-24 ENCOUNTER — Other Ambulatory Visit: Payer: Self-pay

## 2020-07-24 DIAGNOSIS — R42 Dizziness and giddiness: Secondary | ICD-10-CM

## 2020-07-24 NOTE — Therapy (Signed)
[facility] [address] [place], [place], [address] Phone: [phone]   Fax:  [phone]  Physical Therapy Treatment  Patient Details  Name: Thomas Allison MRN: [id number] Date of Birth: 04-Mar-1966 Referring Provider (PT): Dr. Mcarthur Rossetti   Encounter Date: 07/24/2020   PT End of Session - 07/24/20 1405    Visit Number 5    Number of Visits 9    Date for PT Re-Evaluation 08/14/20    Authorization Type eval: 06/19/20    PT Start Time 0569    PT Stop Time 1445    PT Time Calculation (min) 42 min    Equipment Utilized During Treatment Gait belt    Activity Tolerance Patient tolerated treatment well    Behavior During Therapy [facility] for tasks assessed/performed           History reviewed. No pertinent past medical history.  Past Surgical History:  Procedure Laterality Date  . MENISCUS REPAIR    . ROTATOR CUFF REPAIR      There were no vitals filed for this visit.   Subjective Assessment - 07/24/20 1403    Subjective Pt reports that he is doing well today. He has had minimal dizziness over the last couple weeks. He was able to go for a run and had a little bit of dizziness afterward. He has been performing his HEP intermittently with only minimal dizziness. No specific questions or concerns.    Pertinent History Pt is referred by Dr. Mcarthur Rossetti for vestibular therapy related to his dizziness. 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 He started having vertigo which lasted 1-2 minutes. 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 He also felt like he was bout to pass out, had nausea, and became diaphoretic. He had to excuse himself and went to his desk. Pt states that the total duration of the symptoms were about 1 hour but gradually improved over that time. He had not eaten that morning and was told by some  of his colleagues that it may be related to low blood sugar however he normally does not eat breakfast. He called his PCP who advised him to go to the ER. He went to an urgent care instead and due to some concerns on his EKG and his presentation was sent to the ED for cardiac enzymes. He went to the ER however the EKG changes noted at the urgent care were similar to other prior EKGs the ED provider had on file and his troponin was negative. He was discharged to follow up with his PCP. His PCP felt that his dizziness was related to peripheral vertigo and he was advised to do the Epley maneuver at home. Pt has not tried the Epley maneuver at home. Since the the initial onset the severe dizziness has resolved but he continues to report baseline dizziness which worsens with quick head movements. He notices that his symptoms are worse in the morning and get better as the day progresses. Symptoms worsen when laying down in bed. Denies any similar episodes in the past. No previous cardiac issues. Denies any chest pain or palpitations. Reports intermittent headaches in the morning but no history of migraines. No visual changes during these episodes. Denies auditory or visual symptoms. He states that if music is too loud he sometimes gets a "static sound in his ear" but otherwise  denies tinnitus. Denies numbness/tingling or focal weakness. He does confirm that he developed a cold right after his symptoms started. It has since resolved without any additional intervention.    Diagnostic tests None    Patient Stated Goals Decrease dizziness    Currently in Pain? No/denies            TREATMENT   Neuromuscular Re-education VOR x 1 horizontal with forward and backward ambulation 75' x 3 each, denies dizziness; Forward and retro gait in hallway with vertical ball toss to self with head/eye follow x 70' each, denies dizziness; Forward and retro gait in hallway with horizontal ball toss to therapist and head/eye  follow x 70' each to both sides, denies dizziness; Forward and retro gait in hallway with horizontal ball bounce to therapist and head/eye follow x 70' each to both sides, dizziness reported during retro ambulation; Airex balance beam tandem gait x 6 lengths; Airex tandem balance with horizontal and vertical head turns alternating forward LE x 30s each bilaterally, intermittent LOB; Airex tandem balance with eyes closed alternating forward LE x 30s each; Airex balance beam side stepping x 6 lengths; Airex balance beam side stepping with horizontal and vertical head turns x 6 lengths each; Forward and retro gait outside with vertical ball toss to self with head/eye follow x 40' each; Forward and retro gait outside with horizontal ball toss to therapist with head/eye follow x 40' each; Tossing rainbow ball outside with head/eye follow x 30s, no dizziness reported; Rainbow ball kicks outside, no dizziness reported; 1/2 foam roll A/P static balance x 30s; 1/2 foam roll A/P balance with horizontal followed by vertical head turns x 30s each; 1/2 foam roll tandem balance alternating forward LE x 30s each;    Pt educated throughout session about proper posture and technique with exercises. Improved exercise technique, movement at target joints, use of target muscles after min to mod verbal, visual, tactile cues.     Continued to challenge patient with adaptation and habituation exercises.  He reports very infrequent and only mild dizziness during session but mostly remained symptom-free.  Worked with patient outside on grass with rainbow ball to provide a visually busy environment as well as an unstable surface.  Patient denies any increase in dizziness.  Overall patient is making very good progress towards his goals and will likely be ready to discharge pending outcome measures at his next session. Pt encouraged to continue HEP and follow-up was scheduled. Will continue to progress adaptation and  habituation exercises to continue improving symptoms and function at home and work.                                   PT Short Term Goals - 07/10/20 1408      PT SHORT TERM GOAL #1   Title Pt will be independent with HEP in order to improve dizziness in order to improve symptom-free function at home and work.    Time 4    Period Weeks    Status Achieved    Target Date 07/17/20             PT Long Term Goals - 07/10/20 1408      PT LONG TERM GOAL #1   Title Pt will increase FGA to 30/30 and be able to perform horizontal and vertical head turns during ambulation 100% of the time without slowing of speed or lateral staggerring in order  to be able to shop for groceries and drive his car without any increase in his symptoms    Baseline 06/19/20: FGA 27/30 (points lost for horizontal and vertical head turns); 07/10/20: 30/30    Time 8    Period Weeks    Status Achieved      PT LONG TERM GOAL #2   Title Pt will decrease DHI score by at least 18 points in order to demonstrate clinically significant reduction in disability    Baseline 06/19/20: 28/100; 07/10/20: 16/22    Time 8    Period Weeks    Status Partially Met    Target Date 08/14/20      PT LONG TERM GOAL #3   Title Pt will improve FOTO score to at least 63 in order to demonstrate significant improvement in function related to dizziness.    Baseline 06/19/20: 52; 07/10/20: 56    Time 8    Period Weeks    Status Partially Met    Target Date 08/14/20                 Plan - 07/24/20 1405    Clinical Impression Statement Continued to challenge patient with adaptation and habituation exercises.  He reports very infrequent and only mild dizziness during session but mostly remained symptom-free.  Worked with patient outside on grass with rainbow ball to provide a visually busy environment as well as an unstable surface.  Patient denies any increase in dizziness.  Overall patient is making very good  progress towards his goals and will likely be ready to discharge pending outcome measures at his next session. Pt encouraged to continue HEP and follow-up was scheduled. Will continue to progress adaptation and habituation exercises to continue improving symptoms and function at home and work.    Personal Factors and Comorbidities Comorbidity 1    Comorbidities Seasonal allergies    Examination-Activity Limitations [place];Transfers    Examination-Participation Restrictions Community Activity;Driving;Occupation;Shop    Stability/Clinical Decision Making Stable/Uncomplicated    Rehab Potential Excellent    PT Frequency 1x / week    PT Duration 8 weeks    PT Treatment/Interventions ADLs/Self Care Home Management;Aquatic Therapy;Biofeedback;Canalith Repostioning;Cryotherapy;Electrical Stimulation;Iontophoresis 82m/ml Dexamethasone;Moist Heat;Traction;Ultrasound;DME Instruction;Gait training;Stair training;Functional mobility training;Therapeutic activities;Therapeutic exercise;Balance training;Neuromuscular re-education;Patient/family education;Manual techniques;Passive range of motion;Dry needling;Vestibular;Spinal Manipulations;Joint Manipulations    PT Next Visit Plan Update outcome measures and goals, consider possible discharge, review HEP    PT Home Exercise Plan Medbridge Access Code: [email or web address]           Patient will benefit from skilled therapeutic intervention in order to improve the following deficits and impairments:  Decreased balance,Dizziness  Visit Diagnosis: Dizziness and giddiness     Problem List There are no problems to display for this patient.  JLyndel SafeHuprich PT, DPT, GCS  Hoyte Ziebell 07/25/2020, 10:37 AM  [place] [facility] [place] [address]Phone: [phone]  Fax:  [phone] Name: Thomas WelkerMRN: [id number] of Birth: [birth year]

## 2021-07-24 IMAGING — CR DG CHEST 2V
1 series · 2 of 2 positions shown · non-contrast
Comparison: None.

CLINICAL DATA: Dizziness abnormal EKG

EXAM:
CHEST - 2 VIEW

[Series 1: dg chest 2 view · 0.14mm/px · 2 of 2 slices shown]
[im 1/2]
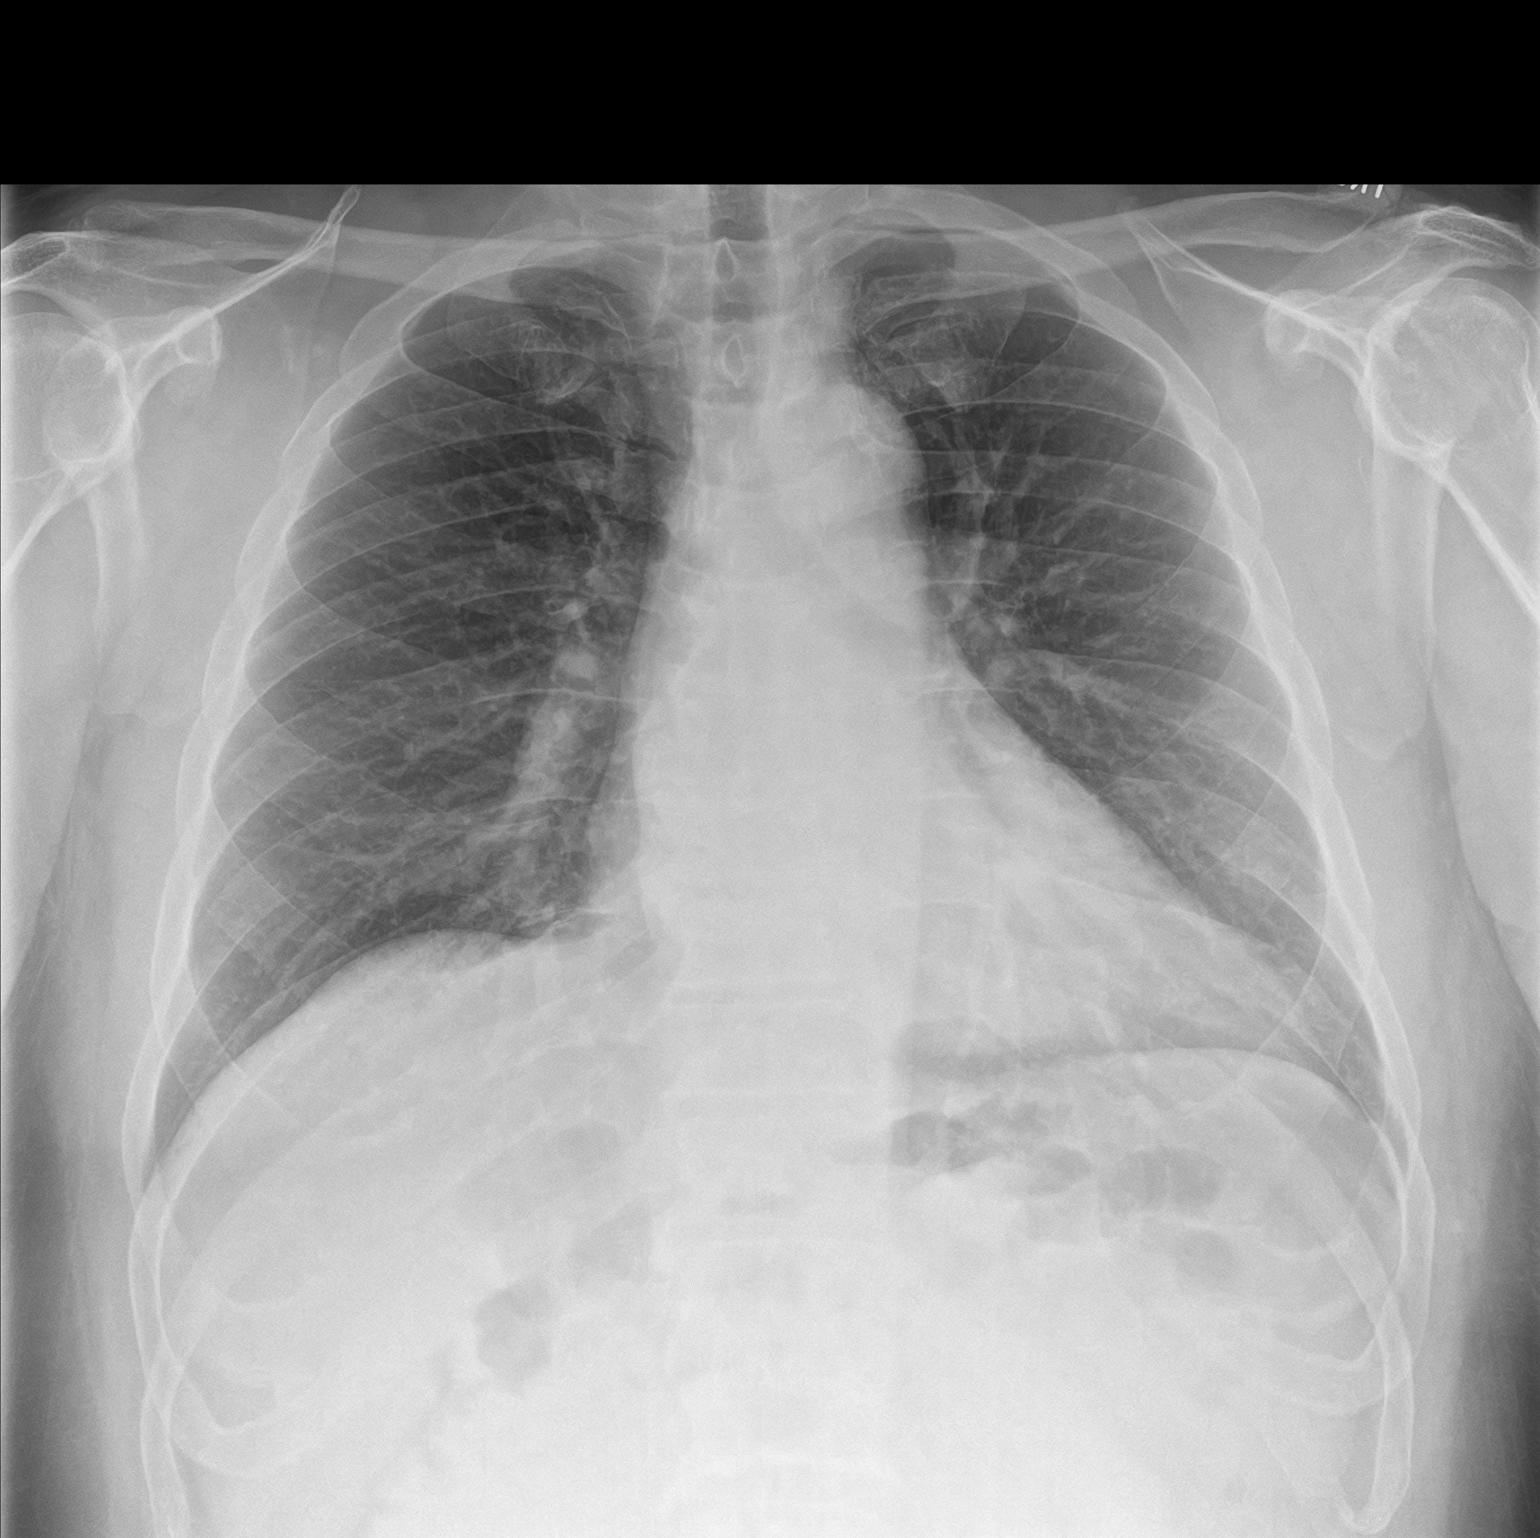
[im 2/2]
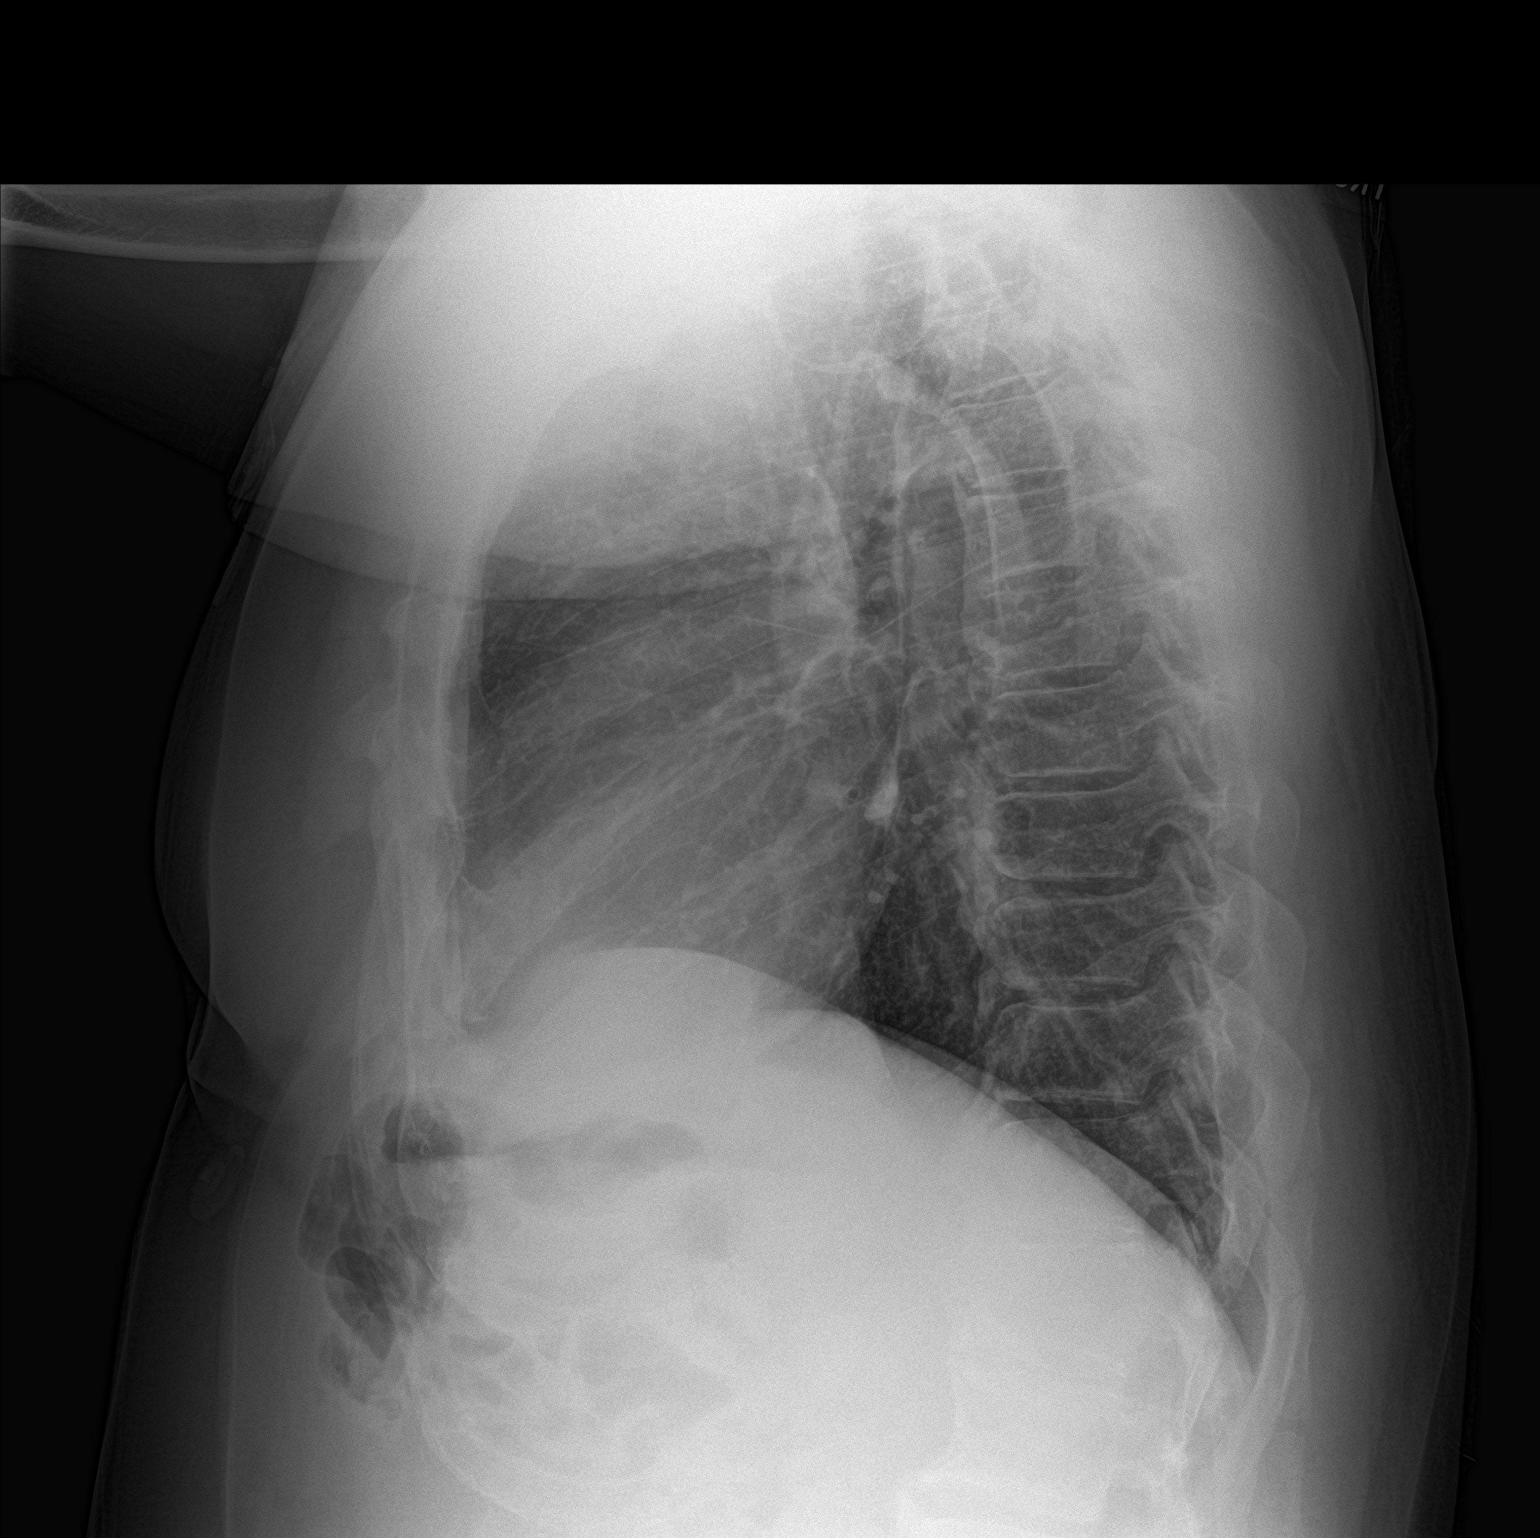

[2 of 2 positions shown; findings below may reference images not displayed]

FINDINGS: The heart size and mediastinal contours are within normal limits.
Both lungs are clear. The visualized skeletal structures are
unremarkable.
IMPRESSION: No active cardiopulmonary disease.
